# Patient Record
Sex: Female | Born: 1975 | Race: White | Hispanic: No | Marital: Married | Smoking: Never smoker
Health system: Southern US, Community
[De-identification: ages and names within clinical notes are randomized; demographics above are authoritative.]

## PROBLEM LIST (undated history)

## (undated) DIAGNOSIS — J45909 Unspecified asthma, uncomplicated: Secondary | ICD-10-CM

## (undated) DIAGNOSIS — M1990 Unspecified osteoarthritis, unspecified site: Secondary | ICD-10-CM

## (undated) HISTORY — PX: APPENDECTOMY: SHX54

## (undated) HISTORY — PX: TONSILLECTOMY: SUR1361

---

## 2015-10-26 ENCOUNTER — Emergency Department
Admission: EM | Admit: 2015-10-26 | Discharge: 2015-10-26 | Disposition: A | Discharge status: Home / Self Care | Payer: Managed Care, Other (non HMO) | Attending: Emergency Medicine | Admitting: Emergency Medicine

## 2015-10-26 ENCOUNTER — Encounter: Payer: Self-pay | Admitting: *Deleted

## 2015-10-26 DIAGNOSIS — K120 Recurrent oral aphthae: Secondary | ICD-10-CM | POA: Insufficient documentation

## 2015-10-26 DIAGNOSIS — M1990 Unspecified osteoarthritis, unspecified site: Secondary | ICD-10-CM | POA: Insufficient documentation

## 2015-10-26 DIAGNOSIS — T7840XA Allergy, unspecified, initial encounter: Secondary | ICD-10-CM | POA: Diagnosis present

## 2015-10-26 HISTORY — DX: Unspecified osteoarthritis, unspecified site: M19.90

## 2015-10-26 LAB — POCT PREGNANCY, URINE: PREG TEST UR: NEGATIVE

## 2015-10-26 LAB — CBC WITH DIFFERENTIAL/PLATELET
BASOS ABS: 0.1 10*3/uL (ref 0–0.1)
BASOS PCT: 1 %
EOS ABS: 0.3 10*3/uL (ref 0–0.7)
Eosinophils Relative: 4 %
HEMATOCRIT: 41.6 % (ref 35.0–47.0)
HEMOGLOBIN: 14 g/dL (ref 12.0–16.0)
Lymphocytes Relative: 23 %
Lymphs Abs: 1.6 10*3/uL (ref 1.0–3.6)
MCH: 30.1 pg (ref 26.0–34.0)
MCHC: 33.7 g/dL (ref 32.0–36.0)
MCV: 89.2 fL (ref 80.0–100.0)
MONOS PCT: 5 %
Monocytes Absolute: 0.3 10*3/uL (ref 0.2–0.9)
NEUTROS ABS: 4.5 10*3/uL (ref 1.4–6.5)
NEUTROS PCT: 67 %
Platelets: 302 10*3/uL (ref 150–440)
RBC: 4.66 MIL/uL (ref 3.80–5.20)
RDW: 13.9 % (ref 11.5–14.5)
WBC: 6.7 10*3/uL (ref 3.6–11.0)

## 2015-10-26 LAB — BASIC METABOLIC PANEL
ANION GAP: 7 (ref 5–15)
BUN: 13 mg/dL (ref 6–20)
CHLORIDE: 107 mmol/L (ref 101–111)
CO2: 24 mmol/L (ref 22–32)
CREATININE: 0.86 mg/dL (ref 0.44–1.00)
Calcium: 9.6 mg/dL (ref 8.9–10.3)
GFR calc non Af Amer: >60 mL/min (ref >60)
Glucose, Bld: 108 mg/dL — ABNORMAL HIGH (ref 65–99)
POTASSIUM: 4 mmol/L (ref 3.5–5.1)
SODIUM: 138 mmol/L (ref 135–145)

## 2015-10-26 LAB — MONONUCLEOSIS SCREEN: MONO SCREEN: NEGATIVE

## 2015-10-26 MED ORDER — MAGIC MOUTHWASH: 5.0000 mL | Freq: Four times a day (QID) | ORAL | Status: DC

## 2015-10-26 MED ORDER — AZITHROMYCIN 250 MG PO TABS: ORAL_TABLET | ORAL | Status: DC

## 2015-10-26 MED ORDER — DEXAMETHASONE SODIUM PHOSPHATE 10 MG/ML IJ SOLN
10.0000 mg | Freq: Once | INTRAMUSCULAR | Status: AC
  Administered 2015-10-26: 10 mg via INTRAVENOUS

## 2015-10-26 MED ORDER — DEXAMETHASONE SODIUM PHOSPHATE 10 MG/ML IJ SOLN
INTRAMUSCULAR | Status: AC
  Administered 2015-10-26: 10 mg via INTRAVENOUS
  Filled 2015-10-26: qty 1

## 2015-10-26 NOTE — Discharge Instructions (Signed)
Discontinue taking Keflex. Begin taking Zithromax for 5 days. Also use Magic mouthwash 1 teaspoon before meals and at bedtime swish for 3-5 minutes and swallow. Follow-up with Dr. Willeen CassBennett at [facility] if any continued problems.

## 2015-10-26 NOTE — ED Provider Notes (Signed)
[facility] Emergency Department Provider Note   ____________________________________________  Time seen: Approximately 11:32 AM  I have reviewed the triage vital signs and the nursing notes.   HISTORY  Chief Complaint Allergic Reaction   HPI Angela Chandler is a 40 y.o. female is here with complaint of rash to her face and chest. Patient states that she was started on an antibiotic as he had 5 doses of Keflex for "strep throat". Patient [facility] and [facility] with [facility]. She is allergic to penicillin but has not had problems with Keflex in the past. Patient began seeing some rash this morning and felt as if she was wheezing. Patient took Benadryl at approximately 8:00 and also 2 puffs off her daughters albuterol inhaler. Patient states she is breathing much better and there is decreased itching however she is concerned about the rash. She is unaware of any fever. She has continued to eat and drink. She is talking without any problems with her saliva. She rates her pain is 3/10.   Past Medical History  Diagnosis Date  . Arthritis     There are no active problems to display for this patient.   History reviewed. No pertinent past surgical history.  Current Outpatient Rx  Name  Route  Sig  Dispense  Refill  . azithromycin (ZITHROMAX Z-PAK) 250 MG tablet      Take 2 tablets (500 mg) on  Day 1,  followed by 1 tablet (250 mg) once daily on Days 2 through 5.   6 each   0   . magic mouthwash SOLN   Oral   Take 5 mLs by mouth 4 (four) times daily.   100 mL   0     Allergies Penicillins  No family history on file.  Social History Social History  Substance Use Topics  . Smoking status: Never Smoker   . Smokeless tobacco: None  . Alcohol Use: No    Review of Systems Constitutional: No fever/chills Eyes: No visual changes. ENT: Positive sore throat. Cardiovascular: Denies chest pain. Respiratory: Denies  shortness of breath. Positive wheezing prior to arrival in the emergency room. Gastrointestinal: No nausea, no vomiting.   Musculoskeletal: Negative for back pain. Skin: Positive for rash face and anterior chest. Neurological: Negative for headaches, focal weakness or numbness.  10-point ROS otherwise negative.  ____________________________________________   PHYSICAL EXAM:  VITAL SIGNS: ED Triage Vitals  Enc Vitals Group     BP 10/26/15 1101 142/75 mmHg     Pulse Rate 10/26/15 1101 86     Resp 10/26/15 1101 16     Temp 10/26/15 1101 98.6 F (37 C)     Temp Source 10/26/15 1101 Oral     SpO2 10/26/15 1101 98 %     Weight 10/26/15 1101 246 lb (111.585 kg)     Height 10/26/15 1101  (1.676 m)     Head Cir --      Peak Flow --      Pain Score 10/26/15 1102 3     Pain Loc --      Pain Edu? --      Excl. in GC? --     Constitutional: Alert and oriented. Well appearing and in no acute distress. Eyes: Conjunctivae are normal. PERRL. EOMI. Head: Atraumatic. Nose: No congestion/rhinnorhea. Mouth/Throat: Mucous membranes are moist.  Oropharynx non-erythematous.There is a shallow small aphthous type ulcer in the right tonsillar crypts but no exudate or tonsillar  enlargement seen. Neck: No stridor.   Hematological/Lymphatic/Immunilogical: No cervical lymphadenopathy. Cardiovascular: Normal rate, regular rhythm. Grossly normal heart sounds.  Good peripheral circulation. Respiratory: Normal respiratory effort.  No retractions. Lungs CTAB. Currently patient is not having any respiratory issues such as shortness of breath or wheezing. Patient was able to speak in complete sentences without any difficulty. Musculoskeletal: Moves upper and lower extremities without any difficulty. Patient patient had normal gait while in the emergency room. Neurologic:  Normal speech and language. No gross focal neurologic deficits are appreciated. No gait instability. Skin:  Skin is warm, dry. There is  a fine erythematous rash localized on the anterior chest. There is an occasional red spot on her face. No other rash is noted other than her upper extremities. There is no involvement on the back or abdomen. Nothing is seen on the lower extremities. Psychiatric: Mood and affect are normal. Speech and behavior are normal.  ____________________________________________   LABS (all labs ordered are listed, but only abnormal results are displayed)  Labs Reviewed  BASIC METABOLIC PANEL - Abnormal; Notable for the following:    Glucose, Bld 108 (*)    All other components within normal limits  CBC WITH DIFFERENTIAL/PLATELET  MONONUCLEOSIS SCREEN  POC URINE PREG, ED  POCT PREGNANCY, URINE     PROCEDURES  Procedure(s) performed: None  Critical Care performed: No  ____________________________________________   INITIAL IMPRESSION / ASSESSMENT AND PLAN / ED COURSE  Pertinent labs & imaging results that were available during my care of the patient were reviewed by me and considered in my medical decision making (see chart for details).  Patient had no further difficulty and was discharged improved. Patient is to discontinue taking Keflex. Because she had a temperature 100.2 when she was diagnosed with strep throat she was placed on Zithromax for 5 days. She is also given a prescription for Magic mouthwash for her aphthous ulcer on the right side of her soft palate. Patient is follow-[facility] if any continued problems. Patient is encouraged to drink plenty of fluids even if she does not have appetite to eat. ____________________________________________   FINAL CLINICAL IMPRESSION(S) / ED DIAGNOSES  Final diagnoses:  Aphthous ulcer of mouth      NEW MEDICATIONS STARTED DURING THIS VISIT:  Discharge Medication List as of 10/26/2015  1:19 PM    START taking these medications   Details  azithromycin (ZITHROMAX Z-PAK) 250 MG tablet Take 2 tablets (500 mg)  on  Day 1,  followed by 1 tablet (250 mg) once daily on Days 2 through 5., Print    magic mouthwash SOLN Take 5 mLs by mouth 4 (four) times daily., Starting 10/26/2015, Until Discontinued, Print         Note:  This document was prepared using Dragon voice recognition software and may include unintentional dictation errors.    Tommi RumpsRhonda L Summers, PA-C 10/26/15 1458  Arnaldo NatalPaul F Malinda, MD 10/26/15 [phone]

## 2015-10-26 NOTE — ED Notes (Signed)
Pt has taken 5 doses of Keflex for strep throat, pt had hives on face, neck and cheeks today, pt took benadryl prior to coming to ED, pt is in no resp distress

## 2015-10-26 NOTE — ED Notes (Signed)
Patient presents to the ED with rash to face and chest.  Patient states she was recently started on a new antibiotic.  Patient is in no obvious distress at this time.

## 2015-12-05 ENCOUNTER — Encounter: Payer: Self-pay | Admitting: Emergency Medicine

## 2015-12-05 ENCOUNTER — Emergency Department: Payer: Managed Care, Other (non HMO)

## 2015-12-05 ENCOUNTER — Emergency Department
Admission: EM | Admit: 2015-12-05 | Discharge: 2015-12-05 | Disposition: A | Discharge status: Home / Self Care | Payer: Managed Care, Other (non HMO) | Attending: Emergency Medicine | Admitting: Emergency Medicine

## 2015-12-05 DIAGNOSIS — K5900 Constipation, unspecified: Secondary | ICD-10-CM | POA: Diagnosis not present

## 2015-12-05 DIAGNOSIS — M1990 Unspecified osteoarthritis, unspecified site: Secondary | ICD-10-CM | POA: Diagnosis not present

## 2015-12-05 DIAGNOSIS — K824 Cholesterolosis of gallbladder: Secondary | ICD-10-CM | POA: Diagnosis not present

## 2015-12-05 DIAGNOSIS — R1011 Right upper quadrant pain: Secondary | ICD-10-CM | POA: Diagnosis present

## 2015-12-05 DIAGNOSIS — Z79899 Other long term (current) drug therapy: Secondary | ICD-10-CM | POA: Diagnosis not present

## 2015-12-05 DIAGNOSIS — J45909 Unspecified asthma, uncomplicated: Secondary | ICD-10-CM | POA: Insufficient documentation

## 2015-12-05 DIAGNOSIS — N23 Unspecified renal colic: Secondary | ICD-10-CM | POA: Insufficient documentation

## 2015-12-05 DIAGNOSIS — K5909 Other constipation: Secondary | ICD-10-CM

## 2015-12-05 HISTORY — DX: Unspecified asthma, uncomplicated: J45.909

## 2015-12-05 LAB — CBC
HCT: 43.7 % (ref 35.0–47.0)
Hemoglobin: 15 g/dL (ref 12.0–16.0)
MCH: 30.4 pg (ref 26.0–34.0)
MCHC: 34.4 g/dL (ref 32.0–36.0)
MCV: 88.4 fL (ref 80.0–100.0)
Platelets: 221 10*3/uL (ref 150–440)
RBC: 4.94 MIL/uL (ref 3.80–5.20)
RDW: 15 % — ABNORMAL HIGH (ref 11.5–14.5)
WBC: 11.8 10*3/uL — ABNORMAL HIGH (ref 3.6–11.0)

## 2015-12-05 LAB — URINALYSIS COMPLETE WITH MICROSCOPIC (ARMC ONLY)
BILIRUBIN URINE: NEGATIVE
GLUCOSE, UA: NEGATIVE mg/dL
HGB URINE DIPSTICK: NEGATIVE
Ketones, ur: NEGATIVE mg/dL
LEUKOCYTES UA: NEGATIVE
NITRITE: NEGATIVE
Protein, ur: NEGATIVE mg/dL
SPECIFIC GRAVITY, URINE: 1.016 (ref 1.005–1.030)
pH: 6 (ref 5.0–8.0)

## 2015-12-05 LAB — COMPREHENSIVE METABOLIC PANEL
ALT: 19 U/L (ref 14–54)
ANION GAP: 6 (ref 5–15)
AST: 21 U/L (ref 15–41)
Albumin: 4.1 g/dL (ref 3.5–5.0)
Alkaline Phosphatase: 54 U/L (ref 38–126)
BILIRUBIN TOTAL: 0.4 mg/dL (ref 0.3–1.2)
BUN: 13 mg/dL (ref 6–20)
CO2: 24 mmol/L (ref 22–32)
Calcium: 8.9 mg/dL (ref 8.9–10.3)
Chloride: 107 mmol/L (ref 101–111)
Creatinine, Ser: 0.76 mg/dL (ref 0.44–1.00)
Glucose, Bld: 126 mg/dL — ABNORMAL HIGH (ref 65–99)
POTASSIUM: 3.9 mmol/L (ref 3.5–5.1)
Sodium: 137 mmol/L (ref 135–145)
TOTAL PROTEIN: 7.4 g/dL (ref 6.5–8.1)

## 2015-12-05 LAB — LIPASE, BLOOD: Lipase: 37 U/L (ref 11–51)

## 2015-12-05 LAB — POCT PREGNANCY, URINE: Preg Test, Ur: NEGATIVE

## 2015-12-05 MED ORDER — IBUPROFEN 800 MG PO TABS: 800.0000 mg | ORAL_TABLET | Freq: Four times a day (QID) | ORAL | Status: DC | PRN

## 2015-12-05 MED ORDER — DIATRIZOATE MEGLUMINE & SODIUM 66-10 % PO SOLN
15.0000 mL | Freq: Once | ORAL | Status: AC
  Administered 2015-12-05: 15 mL via ORAL

## 2015-12-05 MED ORDER — IOPAMIDOL (ISOVUE-300) INJECTION 61%
100.0000 mL | Freq: Once | INTRAVENOUS | Status: AC | PRN
  Administered 2015-12-05: 100 mL via INTRAVENOUS

## 2015-12-05 MED ORDER — TAMSULOSIN HCL 0.4 MG PO CAPS: 0.4000 mg | ORAL_CAPSULE | Freq: Every day | ORAL | Status: DC

## 2015-12-05 MED ORDER — KETOROLAC TROMETHAMINE 30 MG/ML IJ SOLN
30.0000 mg | Freq: Once | INTRAMUSCULAR | Status: AC
  Administered 2015-12-05: 30 mg via INTRAVENOUS
  Filled 2015-12-05: qty 1

## 2015-12-05 MED ORDER — ONDANSETRON HCL 4 MG/2ML IJ SOLN
4.0000 mg | Freq: Once | INTRAMUSCULAR | Status: AC
  Administered 2015-12-05: 4 mg via INTRAVENOUS
  Filled 2015-12-05: qty 2

## 2015-12-05 MED ORDER — HYDROCODONE-ACETAMINOPHEN 5-325 MG PO TABS: 1.0000 | ORAL_TABLET | Freq: Four times a day (QID) | ORAL | Status: DC | PRN

## 2015-12-05 MED ORDER — MORPHINE SULFATE (PF) 4 MG/ML IV SOLN
4.0000 mg | Freq: Once | INTRAVENOUS | Status: AC
  Administered 2015-12-05: 4 mg via INTRAVENOUS
  Filled 2015-12-05: qty 1

## 2015-12-05 MED ORDER — SODIUM CHLORIDE 0.9 % IV BOLUS (SEPSIS)
1000.0000 mL | Freq: Once | INTRAVENOUS | Status: AC
  Administered 2015-12-05: 1000 mL via INTRAVENOUS

## 2015-12-05 MED ORDER — HYDROMORPHONE HCL 1 MG/ML IJ SOLN
1.0000 mg | Freq: Once | INTRAMUSCULAR | Status: AC
  Administered 2015-12-05: 1 mg via INTRAVENOUS

## 2015-12-05 NOTE — ED Notes (Signed)
Pt reports vomiting since yesterday, reports back pain that radiates through to right front of abdomen.

## 2015-12-05 NOTE — Discharge Instructions (Signed)
Stay hydrated.  Take motrin for pain.   Take vicodin for severe pain.   Take zofran for nausea.   Take flomax daily.   You have polyp in your gallbladder. You may need follow up ultrasounds with your doctor. Please talk to your doctor about it.   See urology if you still have pain in a week   Return to ER if you have worse abdominal pain, vomiting, fevers.

## 2015-12-05 NOTE — ED Provider Notes (Signed)
CSN: [id number]     Arrival date & time 12/05/15  0856 History   First MD Initiated Contact with Patient 12/05/15 0935     Chief Complaint  Patient presents with  . Emesis  . Back Pain     (Consider location/radiation/quality/duration/timing/severity/associated sxs/prior Treatment) The history is provided by the patient.  Emelynn Rance is a 40 y.o. female hx of psoriatic arthritis, here with vomiting, flank pain. Patient states that she has acute onset of right upper quadrant pain radiating to her back since yesterday. States that the pain is sharp and associated with some nausea. Had several episodes of vomiting last night that improved so she was able to eat dinner. This morning woke up and had worsening right upper quadrant pain and was unable to keep anything down. Denies any fevers or chills. Denies any Urinary symptoms and has no history of gallstones or kidney stones. Has previous history of appendectomy and tonsillectomy in the past.    Past Medical History  Diagnosis Date  . Arthritis   . Asthma    Past Surgical History  Procedure Laterality Date  . Appendectomy    . Tonsillectomy     No family history on file. Social History  Substance Use Topics  . Smoking status: Never Smoker   . Smokeless tobacco: None  . Alcohol Use: No   OB History    No data available     Review of Systems  Gastrointestinal: Positive for vomiting.  Musculoskeletal: Positive for back pain.  All other systems reviewed and are negative.     Allergies  Keflex; Penicillin g; and Penicillins  Home Medications   Prior to Admission medications   Medication Sig Start Date End Date Taking? Authorizing Provider  meloxicam (MOBIC) 15 MG tablet Take 1 tablet by mouth daily. 12/05/15  Yes Historical Provider, MD   BP 140/82 mmHg  Pulse 87  Temp(Src) 98.4 F (36.9 C) (Oral)  Resp 16  Ht  (1.676 m)  Wt 245 lb (111.131 kg)  BMI 39.56 kg/m2  SpO2 98%  LMP 11/16/2015 Physical Exam   Constitutional: She is oriented to person, place, and time.  Uncomfortable   HENT:  Head: Normocephalic.  MM slightly dry   Eyes: Conjunctivae are normal. Pupils are equal, round, and reactive to light.  Neck: Normal range of motion. Neck supple.  Cardiovascular: Normal rate, regular rhythm and normal heart sounds.   Pulmonary/Chest: Effort normal and breath sounds normal. No respiratory distress. She has no wheezes. She has no rales.  Abdominal: Soft. Bowel sounds are normal.  + RUQ tenderness. Mild murphy's sign. Mild R CVAT   Musculoskeletal: Normal range of motion.  Neurological: She is alert and oriented to person, place, and time.  Skin: Skin is warm and dry.  Psychiatric: She has a normal mood and affect. Her behavior is normal. Judgment and thought content normal.  Nursing note and vitals reviewed.   ED Course  Procedures (including critical care time) Labs Review Labs Reviewed  COMPREHENSIVE METABOLIC PANEL - Abnormal; Notable for the following:    Glucose, Bld 126 (*)    All other components within normal limits  CBC - Abnormal; Notable for the following:    WBC 11.8 (*)    RDW 15.0 (*)    All other components within normal limits  URINALYSIS COMPLETEWITH MICROSCOPIC ([facility] ONLY) - Abnormal; Notable for the following:    Color, Urine YELLOW (*)    APPearance CLOUDY (*)    Bacteria, UA RARE (*)  Squamous Epithelial / LPF 0-5 (*)    All other components within normal limits  LIPASE, BLOOD  POC URINE PREG, ED  POCT PREGNANCY, URINE    Imaging Review Ct Abdomen Pelvis W Contrast  12/05/2015  CLINICAL DATA:  Mid upper abdominal pain and burning with vomiting for 1 day, status post appendectomy EXAM: CT ABDOMEN AND PELVIS WITH CONTRAST TECHNIQUE: Multidetector CT imaging of the abdomen and pelvis was performed using the standard protocol following bolus administration of intravenous contrast. CONTRAST:  100mL ISOVUE-300 IOPAMIDOL (ISOVUE-300) INJECTION 61%  COMPARISON:  None. FINDINGS: Lower chest:  Lung bases are unremarkable. Hepatobiliary: Enhanced liver shows no focal mass. No biliary ductal dilatation. No calcified gallstones are noted within gallbladder. Pancreas: Enhanced pancreas is unremarkable. Spleen: Enhanced spleen is unremarkable. Adrenals/Urinary Tract: No adrenal gland mass. Enhanced kidneys are symmetrical in size. No hydronephrosis or hydroureter. No nephrolithiasis. No calcified ureteral calculi. Stomach/Bowel: No small bowel obstruction. Oral contrast material was given to the patient. Moderate stool noted within cecum. No pericecal inflammation. The patient is status post appendectomy. No evidence of colitis or diverticulitis. Moderate stool noted within rectum. The rectum measures 6.1 cm in diameter. Mild fecal impaction cannot be excluded. Vascular/Lymphatic: No aortic aneurysm. No retroperitoneal or mesenteric adenopathy. Reproductive: The uterus is anteflexed unremarkable. No adnexal masses noted. Probable hemorrhagic follicle within left ovary measures 1.8 cm. No pelvic free fluid is noted. Other: The urinary bladder is unremarkable. No ascites or free abdominal air. No inguinal adenopathy. Tiny umbilical hernia containing fat without evidence of acute complication. Musculoskeletal: No destructive bony lesions are noted. Minimal degenerative changes lower thoracic spine. IMPRESSION: 1. There is no evidence of acute inflammatory process within abdomen. 2. Moderate stool noted within cecum. No pericecal inflammation. The patient is status post appendectomy. 3. No small bowel obstruction. 4. Moderate stool noted within rectum. Mild fecal impaction cannot be excluded. 5. Question hemorrhagic follicle within left ovary measures 1.8 cm. No pelvic free fluid. Electronically Signed   By: Natasha MeadLiviu  Pop M.D.   On: 12/05/2015 13:01   [place] Renal  12/05/2015  CLINICAL DATA:  Right upper quadrant pain. EXAM: RENAL / URINARY TRACT ULTRASOUND COMPLETE  COMPARISON:  No prior. FINDINGS: Right Kidney: Length: 11.4 cm. Echogenicity within normal limits. No mass or hydronephrosis visualized. 7 mm nonobstructing renal calculus. Mm. Left Kidney: Length: 9.9 cm. Echogenicity within normal limits. No mass or hydronephrosis visualized. 8 mm nonobstructing renal calculus. Bladder: Appears normal for degree of bladder distention. IMPRESSION: Bilateral nonobstructing renal calculi. Exam otherwise unremarkable. Electronically Signed   By: Maisie Fushomas  Register   On: 12/05/2015 11:39   [place] Abdomen Limited Ruq  12/05/2015  CLINICAL DATA:  Right quadrant pain. EXAM: US ABDOMEN LIMITED - RIGHT UPPER QUADRANT COMPARISON:  None. FINDINGS: Gallbladder: Small 4 mm polyp noted. No gallstones noted . Minimal prominence of the gallbladder wall at 3 mm noted. No pericholecystic fluid collection. Negative Murphy sign. Common bile duct: Diameter: 3.3 mm Liver: There is echodense consistent fatty infiltration and/or hepatocellular disease. No focal hepatic abnormality identified. IMPRESSION: 1. Liver is echogenic consistent with fatty infiltration and/or hepatocellular disease. No focal hepatic abnormality identified. 2. Small gallbladder polyp at 4 mm. No gallstones noted. Minimal prominence of gallbladder wall 3 mm noted. No pericholecystic fluid collection. Negative Murphy sign. Electronically Signed   By: Maisie Fushomas  Register   On: 12/05/2015 11:37   I have personally reviewed and evaluated these images and lab results as part of my medical decision-making.   EKG Interpretation None  MDM   Final diagnoses:  RUQ pain   Caylie Sandquist is a 40 y.o. female here with RUQ, R flank pain. Likely biliary colic vs renal colic vs pancreatitis. Will get labs, lipase, CMP, UA. Will get RUQ and renal US. Will hydrate and reassess.   12 pm US showed some nonobstructing renal calculi and gallbladder polyp with no pericholecystic fluid. Still has some pain so will get CT ab/pel.   2:05  PM CT showed moderate stool and L ovarian cyst which was chronic. Has no LLQ tenderness. Patient had a bowel movement yesterday. Pain improved now. I think pain likely from renal colic. UA showed no infection. Will dc home with motrin, vicodin, flomax, zofran. No hydro on CT or [place]. Will dc home.   Charlynne Pander, MD 12/05/15 [phone]

## 2016-03-07 ENCOUNTER — Emergency Department
Admission: EM | Admit: 2016-03-07 | Discharge: 2016-03-07 | Disposition: A | Discharge status: Home / Self Care | Payer: Managed Care, Other (non HMO) | Attending: Emergency Medicine | Admitting: Emergency Medicine

## 2016-03-07 ENCOUNTER — Encounter: Payer: Self-pay | Admitting: Emergency Medicine

## 2016-03-07 ENCOUNTER — Emergency Department: Payer: Managed Care, Other (non HMO)

## 2016-03-07 ENCOUNTER — Other Ambulatory Visit: Payer: Self-pay

## 2016-03-07 DIAGNOSIS — R1030 Lower abdominal pain, unspecified: Secondary | ICD-10-CM | POA: Diagnosis not present

## 2016-03-07 DIAGNOSIS — K8050 Calculus of bile duct without cholangitis or cholecystitis without obstruction: Secondary | ICD-10-CM | POA: Diagnosis not present

## 2016-03-07 DIAGNOSIS — R109 Unspecified abdominal pain: Secondary | ICD-10-CM

## 2016-03-07 DIAGNOSIS — J45909 Unspecified asthma, uncomplicated: Secondary | ICD-10-CM | POA: Diagnosis not present

## 2016-03-07 DIAGNOSIS — R1011 Right upper quadrant pain: Secondary | ICD-10-CM | POA: Diagnosis present

## 2016-03-07 DIAGNOSIS — Z79899 Other long term (current) drug therapy: Secondary | ICD-10-CM | POA: Diagnosis not present

## 2016-03-07 DIAGNOSIS — Z791 Long term (current) use of non-steroidal anti-inflammatories (NSAID): Secondary | ICD-10-CM | POA: Insufficient documentation

## 2016-03-07 LAB — URINALYSIS COMPLETE WITH MICROSCOPIC (ARMC ONLY)
Bacteria, UA: NONE SEEN
Bilirubin Urine: NEGATIVE
Glucose, UA: NEGATIVE mg/dL
Ketones, ur: NEGATIVE mg/dL
NITRITE: NEGATIVE
PH: 6 (ref 5.0–8.0)
PROTEIN: 30 mg/dL — AB
SPECIFIC GRAVITY, URINE: 1.014 (ref 1.005–1.030)

## 2016-03-07 LAB — COMPREHENSIVE METABOLIC PANEL
ALK PHOS: 55 U/L (ref 38–126)
ALT: 21 U/L (ref 14–54)
ANION GAP: 6 (ref 5–15)
AST: 21 U/L (ref 15–41)
Albumin: 4.5 g/dL (ref 3.5–5.0)
BILIRUBIN TOTAL: 0.8 mg/dL (ref 0.3–1.2)
BUN: 13 mg/dL (ref 6–20)
CALCIUM: 9.2 mg/dL (ref 8.9–10.3)
CO2: 23 mmol/L (ref 22–32)
Chloride: 108 mmol/L (ref 101–111)
Creatinine, Ser: 0.84 mg/dL (ref 0.44–1.00)
GFR calc non Af Amer: >60 mL/min (ref >60)
Glucose, Bld: 114 mg/dL — ABNORMAL HIGH (ref 65–99)
Potassium: 3.9 mmol/L (ref 3.5–5.1)
SODIUM: 137 mmol/L (ref 135–145)
TOTAL PROTEIN: 7.6 g/dL (ref 6.5–8.1)

## 2016-03-07 LAB — CBC
HCT: 42.6 % (ref 35.0–47.0)
HEMOGLOBIN: 14.2 g/dL (ref 12.0–16.0)
MCH: 29.6 pg (ref 26.0–34.0)
MCHC: 33.4 g/dL (ref 32.0–36.0)
MCV: 88.7 fL (ref 80.0–100.0)
Platelets: 283 10*3/uL (ref 150–440)
RBC: 4.81 MIL/uL (ref 3.80–5.20)
RDW: 13.4 % (ref 11.5–14.5)
WBC: 6.9 10*3/uL (ref 3.6–11.0)

## 2016-03-07 LAB — POCT PREGNANCY, URINE: PREG TEST UR: NEGATIVE

## 2016-03-07 LAB — LIPASE, BLOOD: Lipase: 26 U/L (ref 11–51)

## 2016-03-07 MED ORDER — OXYCODONE-ACETAMINOPHEN 5-325 MG PO TABS: 2.0000 | ORAL_TABLET | Freq: Four times a day (QID) | ORAL | 0 refills | Status: DC | PRN

## 2016-03-07 MED ORDER — ONDANSETRON HCL 4 MG/2ML IJ SOLN
4.0000 mg | Freq: Once | INTRAMUSCULAR | Status: AC
  Administered 2016-03-07: 4 mg via INTRAVENOUS
  Filled 2016-03-07: qty 2

## 2016-03-07 MED ORDER — CIPROFLOXACIN IN D5W 400 MG/200ML IV SOLN
400.0000 mg | Freq: Once | INTRAVENOUS | Status: AC
  Administered 2016-03-07: 400 mg via INTRAVENOUS
  Filled 2016-03-07: qty 200

## 2016-03-07 MED ORDER — HYDROMORPHONE HCL 1 MG/ML IJ SOLN
1.0000 mg | Freq: Once | INTRAMUSCULAR | Status: AC
  Administered 2016-03-07: 1 mg via INTRAVENOUS
  Filled 2016-03-07: qty 1

## 2016-03-07 MED ORDER — CIPROFLOXACIN HCL 500 MG PO TABS: 500.0000 mg | ORAL_TABLET | Freq: Two times a day (BID) | ORAL | 0 refills | Status: DC

## 2016-03-07 MED ORDER — PANTOPRAZOLE SODIUM 40 MG PO TBEC: 40.0000 mg | DELAYED_RELEASE_TABLET | Freq: Every day | ORAL | 1 refills | Status: DC

## 2016-03-07 MED ORDER — SODIUM CHLORIDE 0.9 % IV SOLN
Freq: Once | INTRAVENOUS | Status: AC
  Administered 2016-03-07: 1000 mL via INTRAVENOUS

## 2016-03-07 NOTE — ED Notes (Signed)
Discharge instructions reviewed with patient. Patient verbalized understanding. Patient ambulated to lobby without difficulty.   

## 2016-03-07 NOTE — ED Triage Notes (Signed)
Says ruq p ain with nausea.  Says she has had this before and dx gall bladder polyp.

## 2016-03-07 NOTE — ED Provider Notes (Signed)
[facility] Emergency Department Provider Note        Time seen: ----------------------------------------- 3:10 PM on 03/07/2016 -----------------------------------------    I have reviewed the triage vital signs and the nursing notes.   HISTORY  Chief Complaint Abdominal Pain    HPI Angela Chandler is a 40 y.o. female who presents to the ER for right upper quadrant pain and nausea. Patient states she had this pain before was diagnosed with a gallbladder polyp. She denies fevers or chills, has had nausea and poor appetite, she denies diarrhea.Nothing makes the pain better or worse.   Past Medical History:  Diagnosis Date  . Arthritis   . Asthma     There are no active problems to display for this patient.   Past Surgical History:  Procedure Laterality Date  . APPENDECTOMY    . TONSILLECTOMY      Allergies Keflex [cephalexin]; Penicillin g; and Penicillins  Social History Social History  Substance Use Topics  . Smoking status: Never Smoker  . Smokeless tobacco: Never Used  . Alcohol use No    Review of Systems Constitutional: Negative for fever. Cardiovascular: Negative for chest pain. Respiratory: Negative for shortness of breath. Gastrointestinal: Positive for abdominal pain, nausea Genitourinary: Negative for dysuria. Musculoskeletal: Positive for back pain Skin: Negative for rash. Neurological: Negative for headaches, focal weakness or numbness.  10-point ROS otherwise negative.  ____________________________________________   PHYSICAL EXAM:  VITAL SIGNS: ED Triage Vitals  Enc Vitals Group     BP 03/07/16 1129 138/79     Pulse Rate 03/07/16 1129 79     Resp 03/07/16 1129 16     Temp 03/07/16 1129 98.3 F (36.8 C)     Temp Source 03/07/16 1129 Oral     SpO2 03/07/16 1129 99 %     Weight 03/07/16 1130 239 lb (108.4 kg)     Height 03/07/16 1130 5\' 6"  (1.676 m)     Head Circumference --      Peak Flow --      Pain  Score 03/07/16 1130 7     Pain Loc --      Pain Edu? --      Excl. in GC? --     Constitutional: Alert and oriented. Mild distress Eyes: Conjunctivae are normal. PERRL. Normal extraocular movements. ENT   Head: Normocephalic and atraumatic.   Nose: No congestion/rhinnorhea.   Mouth/Throat: Mucous membranes are moist.   Neck: No stridor. Cardiovascular: Normal rate, regular rhythm. No murmurs, rubs, or gallops. Respiratory: Normal respiratory effort without tachypnea nor retractions. Breath sounds are clear and equal bilaterally. No wheezes/rales/rhonchi. Gastrointestinal: Right upper quadrant tenderness, no rebound or guarding. Normal bowel sounds. Right CVA tenderness. Musculoskeletal: Nontender with normal range of motion in all extremities. No lower extremity tenderness nor edema. Neurologic:  Normal speech and language. No gross focal neurologic deficits are appreciated.  Skin:  Skin is warm, dry and intact. No rash noted. Psychiatric: Mood and affect are normal. Speech and behavior are normal.  ____________________________________________  ED COURSE:  Pertinent labs & imaging results that were available during my care of the patient were reviewed by me and considered in my medical decision making (see chart for details). Clinical Course  Patient presents to the ED with abdominal pain of uncertain etiology. We will assess with ultrasound and basic labs.  Procedures ____________________________________________   LABS (pertinent positives/negatives)  Labs Reviewed  COMPREHENSIVE METABOLIC PANEL - Abnormal; Notable for the following:  Result Value   Glucose, Bld 114 (*)    All other components within normal limits  URINALYSIS COMPLETEWITH MICROSCOPIC ([facility] ONLY) - Abnormal; Notable for the following:    Color, Urine RED (*)    APPearance CLOUDY (*)    Hgb urine dipstick 3+ (*)    Protein, ur 30 (*)    Leukocytes, UA TRACE (*)    Squamous Epithelial / LPF  0-5 (*)    All other components within normal limits  LIPASE, BLOOD  CBC  POCT PREGNANCY, URINE  POC URINE PREG, ED    RADIOLOGY Images were viewed by me  Right upper quadrant ultrasound, renal ultrasound IMPRESSION: Negative exam. IMPRESSION: 1.  Tiny gallbladder polyps measuring up 3 mm.  No gallstones noted.  2. Gallbladder wall is slightly thickened. This can be seen with hypoproteinemia and acalculous cholecystitis. ____________________________________________  FINAL ASSESSMENT AND PLAN  Flank pain  Plan: Patient with labs and imaging as dictated above. Patient's symptoms are probably gallbladder related. Her labs are reassuring other than her urine. She received IV Cipro for her urinalysis. I will refer her to to general surgery for close outpatient follow-up.   Emily FilbertWilliams, Jonathan E, MD   Note: This dictation was prepared with Dragon dictation. Any transcriptional errors that result from this process are unintentional    Emily FilbertJonathan E Williams, MD 03/07/16 1711

## 2016-03-11 ENCOUNTER — Other Ambulatory Visit: Payer: Self-pay

## 2016-03-12 ENCOUNTER — Ambulatory Visit (INDEPENDENT_AMBULATORY_CARE_PROVIDER_SITE_OTHER): Payer: 59 | Admitting: Surgery

## 2016-03-12 ENCOUNTER — Encounter: Payer: Self-pay | Admitting: Surgery

## 2016-03-12 DIAGNOSIS — K824 Cholesterolosis of gallbladder: Secondary | ICD-10-CM

## 2016-03-12 DIAGNOSIS — K8050 Calculus of bile duct without cholangitis or cholecystitis without obstruction: Secondary | ICD-10-CM | POA: Diagnosis not present

## 2016-03-12 NOTE — Patient Instructions (Signed)
Please see blue pre care sheet for surgery information. We have your surgery scheduled for 03/26/16. Please call our office if you have questions or concerns.

## 2016-03-13 ENCOUNTER — Telehealth: Payer: Self-pay | Admitting: Surgery

## 2016-03-13 ENCOUNTER — Encounter: Payer: Self-pay | Admitting: Surgery

## 2016-03-13 DIAGNOSIS — K824 Cholesterolosis of gallbladder: Secondary | ICD-10-CM | POA: Insufficient documentation

## 2016-03-13 DIAGNOSIS — K8050 Calculus of bile duct without cholangitis or cholecystitis without obstruction: Secondary | ICD-10-CM | POA: Insufficient documentation

## 2016-03-13 NOTE — Telephone Encounter (Signed)
Pt advised of pre op date/time and sx date. Sx: 03/26/16 with Dr Dow AdolphLoflin--Laparoscopic cholecystectomy with gram.  Pre op: 03/19/16 between 9-1:00pm--Phone.   Patient made aware to call [phone], between 1-3:00pm the day before surgery, to find out what time to arrive.

## 2016-03-13 NOTE — Progress Notes (Signed)
Subjective:     Patient ID: Angela Chandler, female   DOB: [birth year], 40 y.o.   MRN: [id number]  HPI  40 year old female who comes in with complaints of right upper quadrant pain that has been ongoing for about 3-4 months. Patient states initially this pain intermittently and initially was more with fattier foods. Patient states that over the past week it has been much worse and she was just in the emergency department on 10/19. She was evaluated there and had a normal white blood cell count and shown to have some gallbladder polyps. The patient states that over the past week or so she's had continual soreness in the area and she'll get pain that's cramping intermittent moves around her right side and through to her shoulder blade anytime she eats. Patient has also had a decreased appetite along with this and feelings of bloating increased flatus as well as burping. Patient has had some diarrhea intermittently along with this as well but denies any constipation. Patient states that last Thursday she had a fever however she has not had any other fevers. Also denies any symptoms of dysuria, hematuria, urgency, frequency or foul-smelling urine however she did have a positive UA in the emergency department.  Past Medical History:  Diagnosis Date  . Arthritis   . Asthma    Past Surgical History:  Procedure Laterality Date  . APPENDECTOMY    . TONSILLECTOMY    Right ovarian cyst and Appy in 2001 Tonsils 1996  Family History  Problem Relation Age of Onset  . Hypertension Father    Social History   Social History  . Marital status: Married    Spouse name: N/A  . Number of children: N/A  . Years of education: N/A   Social History Main Topics  . Smoking status: Never Smoker  . Smokeless tobacco: Never Used  . Alcohol use No  . Drug use: Unknown  . Sexual activity: Not Asked   Other Topics Concern  . None   Social History Narrative  . None    Current Outpatient Prescriptions:  .   ciprofloxacin (CIPRO) 500 MG tablet, Take 1 tablet (500 mg total) by mouth 2 (two) times daily., Disp: 20 tablet, Rfl: 0 .  ibuprofen (ADVIL,MOTRIN) 800 MG tablet, Take 1 tablet (800 mg total) by mouth every 6 (six) hours as needed., Disp: 30 tablet, Rfl: 0 .  oxyCODONE-acetaminophen (PERCOCET) 5-325 MG tablet, Take 2 tablets by mouth every 6 (six) hours as needed for moderate pain or severe pain., Disp: 20 tablet, Rfl: 0 .  pantoprazole (PROTONIX) 40 MG tablet, Take 1 tablet (40 mg total) by mouth daily., Disp: 30 tablet, Rfl: 1 .  sulfaSALAzine (AZULFIDINE) 500 MG tablet, Take 1 tablet by mouth 2 (two) times daily with a meal., Disp: , Rfl:  Allergies  Allergen Reactions  . Keflex [Cephalexin] Nausea And Vomiting and Rash  . Penicillin G Rash and Nausea And Vomiting  . Penicillins Rash    Has patient had a PCN reaction causing immediate rash, facial/tongue/throat swelling, SOB or lightheadedness with hypotension: Yes Has patient had a PCN reaction causing severe rash involving mucus membranes or skin necrosis: No Has patient had a PCN reaction that required hospitalization No Has patient had a PCN reaction occurring within the last 10 years: No If all of the above answers are "NO", then may proceed with Cephalosporin use.     Review of Systems  Constitutional: Positive for appetite change, chills, fatigue and fever. Negative for unexpected weight  change.  HENT: Negative for congestion and sore throat.   Respiratory: Negative for cough, chest tightness and shortness of breath.   Cardiovascular: Negative for chest pain and leg swelling.  Gastrointestinal: Positive for abdominal pain, diarrhea and nausea. Negative for abdominal distention, blood in stool, constipation and vomiting.  Genitourinary: Negative for dysuria, flank pain, hematuria and urgency.  Musculoskeletal: Negative for back pain and joint swelling.  Skin: Negative for color change, rash and wound.  Neurological: Negative for  dizziness and weakness.  Hematological: Negative for adenopathy. Does not bruise/bleed easily.  Psychiatric/Behavioral: Negative for agitation. The patient is not nervous/anxious.   All other systems reviewed and are negative.    Vitals:   03/12/16 1407  BP: 139/80  Pulse: 97  Temp: 97.9 F (36.6 C)       Objective:   Physical Exam  Constitutional: She is oriented to person, place, and time. She appears well-developed and well-nourished. No distress.  HENT:  Head: Normocephalic and atraumatic.  Right Ear: External ear normal.  Left Ear: External ear normal.  Nose: Nose normal.  Mouth/Throat: Oropharynx is clear and moist. No oropharyngeal exudate.  Eyes: EOM are normal. Pupils are equal, round, and reactive to light. No scleral icterus.  Neck: Normal range of motion. Neck supple. No tracheal deviation present.  Cardiovascular: Normal rate, regular rhythm, normal heart sounds and intact distal pulses.  Exam reveals no gallop and no friction rub.   No murmur heard. Pulmonary/Chest: Effort normal and breath sounds normal. No respiratory distress. She has no wheezes. She has no rales.  Abdominal: Soft. Bowel sounds are normal. She exhibits no distension. There is tenderness. There is no rebound and no guarding.  Obese, soft, tender in epigastrium and RUQ, no CVA tenderness  Musculoskeletal: Normal range of motion. She exhibits no edema, tenderness or deformity.  Neurological: She is alert and oriented to person, place, and time. No cranial nerve deficit.  Skin: Skin is warm and dry. No rash noted. No erythema. No pallor.  Psychiatric: She has a normal mood and affect. Her behavior is normal. Judgment and thought content normal.  Vitals reviewed.      CBC Latest Ref Rng & Units 03/07/2016 12/05/2015 10/26/2015  WBC 3.6 - 11.0 K/uL 6.9 11.8(H) 6.7  Hemoglobin 12.0 - 16.0 g/dL 16.1 09.6 04.5  Hematocrit 35.0 - 47.0 % 42.6 43.7 41.6  Platelets 150 - 440 K/uL 283 221 302   CMP Latest  Ref Rng & Units 03/07/2016 12/05/2015 10/26/2015  Glucose 65 - 99 mg/dL 409(W) 119(J) 478(G)  BUN 6 - 20 mg/dL 13 13 13   Creatinine 0.44 - 1.00 mg/dL 9.56 2.13 0.86  Sodium 135 - 145 mmol/L 137 137 138  Potassium 3.5 - 5.1 mmol/L 3.9 3.9 4.0  Chloride 101 - 111 mmol/L 108 107 107  CO2 22 - 32 mmol/L 23 24 24   Calcium 8.9 - 10.3 mg/dL 9.2 8.9 9.6  Total Protein 6.5 - 8.1 g/dL 7.6 7.4 -  Total Bilirubin 0.3 - 1.2 mg/dL 0.8 0.4 -  Alkaline Phos 38 - 126 U/L 55 54 -  AST 15 - 41 U/L 21 21 -  ALT 14 - 54 U/L 21 19 -   U/S: IMPRESSION: 1.  Tiny gallbladder polyps measuring up 3 mm.  No gallstones noted. 2. Gallbladder wall is slightly thickened. This can be seen with hypoproteinemia and acalculous cholecystitis.   Assessment:     40 yr old with gallbladder polyps and biliary colic    Plan:  I have personally reviewed her past medical history that includes psoriatic arthritis which is controlled mainly with ibuprofen or sulfasalazine. I have personally reviewed her laboratory values which did show an elevated white blood cell count as well as a UA that was positive however her LFTs were all within normal limits. Personally reviewed her ultrasound images which do show some gallbladder polyps however did not see any wall thickening or any dilatation of the common bile duct. I've also reviewed the radiology read as above.  I did discuss with her that given her gallbladder polyps as well as her symptoms of biliary colic that she would likely benefit from having her gallbladder removed.  The risks, benefits, complications, treatment options, and expected outcomes were discussed with the patient. The possibilities of bleeding, recurrent infection, finding a normal gallbladder, perforation of viscus organs, damage to surrounding structures, bile leak, abscess formation, needing a drain placed, the need for additional procedures, reaction to medication, pulmonary aspiration,  failure to diagnose a  condition, the possible need to convert to an open procedure, and creating a complication requiring transfusion or operation were discussed with the patient. The patient and/or family concurred with the proposed plan, giving informed consent. We will schedule her for Lap chole on 11/7.

## 2016-03-14 ENCOUNTER — Telehealth: Payer: Self-pay

## 2016-03-14 ENCOUNTER — Observation Stay
Admission: EM | Admit: 2016-03-14 | Discharge: 2016-03-16 | DRG: 418 | Disposition: A | Discharge status: Home / Self Care | Payer: Managed Care, Other (non HMO) | Attending: Surgery | Admitting: Surgery

## 2016-03-14 ENCOUNTER — Emergency Department: Payer: Managed Care, Other (non HMO)

## 2016-03-14 ENCOUNTER — Encounter: Payer: Self-pay | Admitting: Emergency Medicine

## 2016-03-14 DIAGNOSIS — Z6838 Body mass index (BMI) 38.0-38.9, adult: Secondary | ICD-10-CM | POA: Diagnosis not present

## 2016-03-14 DIAGNOSIS — E669 Obesity, unspecified: Secondary | ICD-10-CM | POA: Insufficient documentation

## 2016-03-14 DIAGNOSIS — Z8249 Family history of ischemic heart disease and other diseases of the circulatory system: Secondary | ICD-10-CM | POA: Diagnosis not present

## 2016-03-14 DIAGNOSIS — Z880 Allergy status to penicillin: Secondary | ICD-10-CM | POA: Insufficient documentation

## 2016-03-14 DIAGNOSIS — L4050 Arthropathic psoriasis, unspecified: Secondary | ICD-10-CM | POA: Diagnosis not present

## 2016-03-14 DIAGNOSIS — N179 Acute kidney failure, unspecified: Secondary | ICD-10-CM | POA: Diagnosis not present

## 2016-03-14 DIAGNOSIS — Z881 Allergy status to other antibiotic agents status: Secondary | ICD-10-CM | POA: Insufficient documentation

## 2016-03-14 DIAGNOSIS — K810 Acute cholecystitis: Secondary | ICD-10-CM | POA: Diagnosis not present

## 2016-03-14 DIAGNOSIS — Z79899 Other long term (current) drug therapy: Secondary | ICD-10-CM | POA: Diagnosis not present

## 2016-03-14 DIAGNOSIS — R1011 Right upper quadrant pain: Secondary | ICD-10-CM

## 2016-03-14 LAB — CBC
HEMATOCRIT: 44.5 % (ref 35.0–47.0)
HEMOGLOBIN: 15.3 g/dL (ref 12.0–16.0)
MCH: 30 pg (ref 26.0–34.0)
MCHC: 34.3 g/dL (ref 32.0–36.0)
MCV: 87.4 fL (ref 80.0–100.0)
PLATELETS: 267 10*3/uL (ref 150–440)
RBC: 5.09 MIL/uL (ref 3.80–5.20)
RDW: 13.3 % (ref 11.5–14.5)
WBC: 7.4 10*3/uL (ref 3.6–11.0)

## 2016-03-14 LAB — URINALYSIS COMPLETE WITH MICROSCOPIC (ARMC ONLY)
Bilirubin Urine: NEGATIVE
GLUCOSE, UA: NEGATIVE mg/dL
Ketones, ur: NEGATIVE mg/dL
LEUKOCYTES UA: NEGATIVE
Nitrite: NEGATIVE
PH: 6 (ref 5.0–8.0)
Protein, ur: NEGATIVE mg/dL
SPECIFIC GRAVITY, URINE: 1.006 (ref 1.005–1.030)

## 2016-03-14 LAB — COMPREHENSIVE METABOLIC PANEL
ALK PHOS: 58 U/L (ref 38–126)
ALT: 19 U/L (ref 14–54)
ANION GAP: 7 (ref 5–15)
AST: 23 U/L (ref 15–41)
Albumin: 4.4 g/dL (ref 3.5–5.0)
BUN: 15 mg/dL (ref 6–20)
CALCIUM: 9.7 mg/dL (ref 8.9–10.3)
CHLORIDE: 107 mmol/L (ref 101–111)
CO2: 24 mmol/L (ref 22–32)
CREATININE: 1.77 mg/dL — AB (ref 0.44–1.00)
GFR, EST AFRICAN AMERICAN: 41 mL/min — AB (ref >60)
GFR, EST NON AFRICAN AMERICAN: 36 mL/min — AB (ref >60)
Glucose, Bld: 108 mg/dL — ABNORMAL HIGH (ref 65–99)
Potassium: 3.9 mmol/L (ref 3.5–5.1)
SODIUM: 138 mmol/L (ref 135–145)
Total Bilirubin: 0.3 mg/dL (ref 0.3–1.2)
Total Protein: 7.8 g/dL (ref 6.5–8.1)

## 2016-03-14 LAB — LIPASE, BLOOD: LIPASE: 29 U/L (ref 11–51)

## 2016-03-14 LAB — POCT PREGNANCY, URINE: PREG TEST UR: NEGATIVE

## 2016-03-14 MED ORDER — METRONIDAZOLE IN NACL 5-0.79 MG/ML-% IV SOLN
500.0000 mg | Freq: Three times a day (TID) | INTRAVENOUS | Status: DC
  Administered 2016-03-15 – 2016-03-16 (×4): 500 mg via INTRAVENOUS
  Filled 2016-03-14 (×7): qty 100

## 2016-03-14 MED ORDER — ACETAMINOPHEN 500 MG PO TABS
1000.0000 mg | ORAL_TABLET | Freq: Four times a day (QID) | ORAL | Status: DC
  Administered 2016-03-14 – 2016-03-16 (×4): 1000 mg via ORAL
  Filled 2016-03-14 (×4): qty 2

## 2016-03-14 MED ORDER — SULFASALAZINE 500 MG PO TABS
500.0000 mg | ORAL_TABLET | Freq: Two times a day (BID) | ORAL | Status: DC
  Administered 2016-03-16: 500 mg via ORAL
  Filled 2016-03-14: qty 1

## 2016-03-14 MED ORDER — METRONIDAZOLE IN NACL 5-0.79 MG/ML-% IV SOLN
500.0000 mg | Freq: Once | INTRAVENOUS | Status: AC
  Administered 2016-03-14: 500 mg via INTRAVENOUS
  Filled 2016-03-14: qty 100

## 2016-03-14 MED ORDER — HEPARIN SODIUM (PORCINE) 5000 UNIT/ML IJ SOLN
5000.0000 [IU] | Freq: Three times a day (TID) | INTRAMUSCULAR | Status: DC
  Administered 2016-03-14 – 2016-03-16 (×4): 5000 [IU] via SUBCUTANEOUS
  Filled 2016-03-14 (×4): qty 1

## 2016-03-14 MED ORDER — PROMETHAZINE HCL 25 MG/ML IJ SOLN
12.5000 mg | Freq: Once | INTRAMUSCULAR | Status: AC
Start: 2016-03-14 — End: 2016-03-14
  Administered 2016-03-14: 12.5 mg via INTRAVENOUS
  Filled 2016-03-14: qty 1

## 2016-03-14 MED ORDER — HYDROMORPHONE HCL 1 MG/ML IJ SOLN
0.5000 mg | INTRAMUSCULAR | Status: DC | PRN
  Administered 2016-03-14 – 2016-03-15 (×4): 0.5 mg via INTRAVENOUS
  Filled 2016-03-14 (×4): qty 1

## 2016-03-14 MED ORDER — ONDANSETRON 4 MG PO TBDP: 4.0000 mg | ORAL_TABLET | Freq: Four times a day (QID) | ORAL | Status: DC | PRN

## 2016-03-14 MED ORDER — FAMOTIDINE IN NACL 20-0.9 MG/50ML-% IV SOLN
20.0000 mg | Freq: Two times a day (BID) | INTRAVENOUS | Status: DC
  Administered 2016-03-14 – 2016-03-16 (×4): 20 mg via INTRAVENOUS
  Filled 2016-03-14 (×5): qty 50

## 2016-03-14 MED ORDER — SODIUM CHLORIDE 0.9 % IV SOLN
INTRAVENOUS | Status: DC
  Administered 2016-03-14 – 2016-03-15 (×2): via INTRAVENOUS

## 2016-03-14 MED ORDER — SODIUM CHLORIDE 0.9 % IV BOLUS (SEPSIS)
1000.0000 mL | Freq: Once | INTRAVENOUS | Status: AC
  Administered 2016-03-14: 1000 mL via INTRAVENOUS

## 2016-03-14 MED ORDER — POLYETHYLENE GLYCOL 3350 17 G PO PACK: 17.0000 g | PACK | Freq: Every day | ORAL | Status: DC | PRN

## 2016-03-14 MED ORDER — FENTANYL CITRATE (PF) 100 MCG/2ML IJ SOLN
100.0000 ug | INTRAMUSCULAR | Status: DC | PRN
  Administered 2016-03-14: 100 ug via INTRAVENOUS
  Filled 2016-03-14: qty 2

## 2016-03-14 MED ORDER — CIPROFLOXACIN IN D5W 400 MG/200ML IV SOLN
400.0000 mg | Freq: Two times a day (BID) | INTRAVENOUS | Status: DC
  Administered 2016-03-15 – 2016-03-16 (×3): 400 mg via INTRAVENOUS
  Filled 2016-03-14 (×4): qty 200

## 2016-03-14 MED ORDER — OXYCODONE HCL 5 MG PO TABS
5.0000 mg | ORAL_TABLET | ORAL | Status: DC | PRN
  Administered 2016-03-14: 5 mg via ORAL
  Administered 2016-03-15 – 2016-03-16 (×3): 10 mg via ORAL
  Filled 2016-03-14: qty 2
  Filled 2016-03-14: qty 1
  Filled 2016-03-14 (×2): qty 2

## 2016-03-14 MED ORDER — ONDANSETRON HCL 4 MG/2ML IJ SOLN: 4.0000 mg | Freq: Four times a day (QID) | INTRAMUSCULAR | Status: DC | PRN

## 2016-03-14 NOTE — ED Provider Notes (Signed)
[facility] Emergency Department Provider Note    First MD Initiated Contact with Patient 03/14/16 1520     (approximate)  I have reviewed the triage vital signs and the nursing notes.   HISTORY  Chief Complaint Abdominal Pain    HPI Angela Chandler is a 40 y.o. female  with recent diagnosis of gallbladder polyps presents with worsening right upper quadrant pain and low-grade fevers. Patient arrives to the ER tachycardic complaining of worsening right upper quadrant pain associated with nausea. No vomiting or diarrhea. Patient was recently started on Cipro for UTI. Patient has been followed up and outpatient surgery clinic with plan for elective cholecystectomy in early November. Due to worsening symptoms she came to the ER today.  She currently rates the pain as a 7 out of 10 in severity with radiation through to her back.   Past Medical History:  Diagnosis Date  . Arthritis   . Asthma     Patient Active Problem List   Diagnosis Date Noted  . Gallbladder polyp 03/13/2016  . Biliary colic 03/13/2016    Past Surgical History:  Procedure Laterality Date  . APPENDECTOMY    . TONSILLECTOMY      Prior to Admission medications   Medication Sig Start Date End Date Taking? Authorizing Provider  ciprofloxacin (CIPRO) 500 MG tablet Take 1 tablet (500 mg total) by mouth 2 (two) times daily. 03/07/16 03/17/16  Emily Filbert, MD  ibuprofen (ADVIL,MOTRIN) 800 MG tablet Take 1 tablet (800 mg total) by mouth every 6 (six) hours as needed. 12/05/15 12/04/16  Charlynne Pander, MD  oxyCODONE-acetaminophen (PERCOCET) 5-325 MG tablet Take 2 tablets by mouth every 6 (six) hours as needed for moderate pain or severe pain. 03/07/16   Emily Filbert, MD  pantoprazole (PROTONIX) 40 MG tablet Take 1 tablet (40 mg total) by mouth daily. 03/07/16 03/07/17  Emily Filbert, MD  sulfaSALAzine (AZULFIDINE) 500 MG tablet Take 1 tablet by mouth 2 (two) times daily  with a meal. 09/18/15   Historical Provider, MD    Allergies Keflex [cephalexin]; Penicillin g; and Penicillins  Family History  Problem Relation Age of Onset  . Hypertension Father     Social History Social History  Substance Use Topics  . Smoking status: Never Smoker  . Smokeless tobacco: Never Used  . Alcohol use No    Review of Systems Patient denies headaches, rhinorrhea, blurry vision, numbness, shortness of breath, chest pain, edema, cough, abdominal pain, nausea, vomiting, diarrhea, dysuria, fevers, rashes or hallucinations unless otherwise stated above in HPI. ____________________________________________   PHYSICAL EXAM:  VITAL SIGNS: Vitals:   03/14/16 1348  BP: (!) 136/92  Pulse: (!) 107  Resp: 20  Temp: 99.2 F (37.3 C)    Constitutional: Alert and oriented. Well appearing and in no acute distress. Eyes: Conjunctivae are normal. PERRL. EOMI. Head: Atraumatic. Nose: No congestion/rhinnorhea. Mouth/Throat: Mucous membranes are moist.  Oropharynx non-erythematous. Neck: No stridor. Painless ROM. No cervical spine tenderness to palpation Hematological/Lymphatic/Immunilogical: No cervical lymphadenopathy. Cardiovascular: Normal rate, regular rhythm. Grossly normal heart sounds.  Good peripheral circulation. Respiratory: Normal respiratory effort.  No retractions. Lungs CTAB. Gastrointestinal: Soft with RUQ ttp. No distention. No abdominal bruits. No CVA tenderness. Genitourinary:  Musculoskeletal: No lower extremity tenderness nor edema.  No joint effusions. Neurologic:  Normal speech and language. No gross focal neurologic deficits are appreciated. No gait instability. Skin:  Skin is warm, dry and intact. No rash noted. Psychiatric: Mood and affect are normal.  Speech and behavior are normal.  ____________________________________________   LABS (all labs ordered are listed, but only abnormal results are displayed)  Results for orders placed or performed  during the hospital encounter of 03/14/16 (from the past 24 hour(s))  Lipase, blood     Status: None   Collection Time: 03/14/16  1:50 PM  Result Value Ref Range   Lipase 29 11 - 51 U/L  Comprehensive metabolic panel     Status: Abnormal   Collection Time: 03/14/16  1:50 PM  Result Value Ref Range   Sodium 138 135 - 145 mmol/L   Potassium 3.9 3.5 - 5.1 mmol/L   Chloride 107 101 - 111 mmol/L   CO2 24 22 - 32 mmol/L   Glucose, Bld 108 (H) 65 - 99 mg/dL   BUN 15 6 - 20 mg/dL   Creatinine, Ser 4.09 (H) 0.44 - 1.00 mg/dL   Calcium 9.7 8.9 - 81.1 mg/dL   Total Protein 7.8 6.5 - 8.1 g/dL   Albumin 4.4 3.5 - 5.0 g/dL   AST 23 15 - 41 U/L   ALT 19 14 - 54 U/L   Alkaline Phosphatase 58 38 - 126 U/L   Total Bilirubin 0.3 0.3 - 1.2 mg/dL   GFR calc non Af Amer 36 (L) >60 mL/min   GFR calc Af Amer 41 (L) >60 mL/min   Anion gap 7 5 - 15  CBC     Status: None   Collection Time: 03/14/16  1:50 PM  Result Value Ref Range   WBC 7.4 3.6 - 11.0 K/uL   RBC 5.09 3.80 - 5.20 MIL/uL   Hemoglobin 15.3 12.0 - 16.0 g/dL   HCT 91.4 78.2 - 95.6 %   MCV 87.4 80.0 - 100.0 fL   MCH 30.0 26.0 - 34.0 pg   MCHC 34.3 32.0 - 36.0 g/dL   RDW 21.3 08.6 - 57.8 %   Platelets 267 150 - 440 K/uL  Urinalysis complete, with microscopic     Status: Abnormal   Collection Time: 03/14/16  1:50 PM  Result Value Ref Range   Color, Urine YELLOW (A) YELLOW   APPearance CLEAR (A) CLEAR   Glucose, UA NEGATIVE NEGATIVE mg/dL   Bilirubin Urine NEGATIVE NEGATIVE   Ketones, ur NEGATIVE NEGATIVE mg/dL   Specific Gravity, Urine 1.006 1.005 - 1.030   Hgb urine dipstick 1+ (A) NEGATIVE   pH 6.0 5.0 - 8.0   Protein, ur NEGATIVE NEGATIVE mg/dL   Nitrite NEGATIVE NEGATIVE   Leukocytes, UA NEGATIVE NEGATIVE   RBC / HPF 0-5 0 - 5 RBC/hpf   WBC, UA 0-5 0 - 5 WBC/hpf   Bacteria, UA RARE (A) NONE SEEN   Squamous Epithelial / LPF 0-5 (A) NONE SEEN   Mucous PRESENT    Hyaline Casts, UA PRESENT   Pregnancy, urine POC     Status:  None   Collection Time: 03/14/16  2:01 PM  Result Value Ref Range   Preg Test, Ur NEGATIVE NEGATIVE   ____________________________________________ ____________________________________________  RADIOLOGY  I personally reviewed all radiographic images ordered to evaluate for the above acute complaints and reviewed radiology reports and findings.  These findings were personally discussed with the patient.  Please see medical record for radiology report. ____________________________________________   PROCEDURES  Procedure(s) performed: none    Critical Care performed: no ____________________________________________   INITIAL IMPRESSION / ASSESSMENT AND PLAN / ED COURSE  Pertinent labs & imaging results that were available during my care of the patient were  reviewed by me and considered in my medical decision making (see chart for details).  DDX: cholecystectomy, cholelithiasis, panceratitis  Marylynn PearsonJennifer Debono is a 40 y.o. who presents to the ED with worsening of right upper quadrant pain. Ordered right upper quadrant ultrasound to evaluate for worsening pathology and possible cholecystitis. She does not have a leukocytosis but does have evidence of a KI. She does have low-grade fever and tachycardia. We'll give IV fluids. Has consult to general surgery regarding worsening pain. Based on her presentation Dr. Aleen CampiPiscoya of general surgery has agreed to admit patient for IV antibiotics, IV fluids and evaluation for operative management.  Have discussed with the patient and available family all diagnostics and treatments performed thus far and all questions were answered to the best of my ability. The patient demonstrates understanding and agreement with plan.   Clinical Course     ____________________________________________   FINAL CLINICAL IMPRESSION(S) / ED DIAGNOSES  Final diagnoses:  RUQ pain      NEW MEDICATIONS STARTED DURING THIS VISIT:  New Prescriptions   No  medications on file     Note:  This document was prepared using Dragon voice recognition software and may include unintentional dictation errors.    Willy EddyPatrick Issachar Broady, MD 03/14/16 [id number]

## 2016-03-14 NOTE — Telephone Encounter (Signed)
Patient called in this time and states that beginning this morning she has had constant worsening Right Upper Quadrant Pain with Nausea and fever of 100.3. She denies vomiting and has been taking her prescribed Cipro per the instructions given.  Spoke with Dr. Excell Seltzerooper and he would like patient to go to the Emergency Room. Dr. Aleen CampiPiscoya is surgeon on call and will be made aware of this patient.

## 2016-03-14 NOTE — ED Notes (Signed)
Dr. Piscoya at bedside at this time.  

## 2016-03-14 NOTE — ED Notes (Signed)
Informed RN bed ready 

## 2016-03-14 NOTE — ED Notes (Signed)
This RN

## 2016-03-14 NOTE — ED Triage Notes (Signed)
Pt to ed with c/o abd pain that started this am.  Pt reports hx of gallbladder problems.  Pt also reports fever today.

## 2016-03-14 NOTE — H&P (Signed)
Date of Admission:  03/14/2016  Reason for Admission:  Abdominal pain  History of Present Illness: Angela PearsonJennifer Steeves is a 40 y.o. female who presents with 1 day history of right upper quadrant abdominal pain associated with nausea. Patient was seen in emergency room on 10/19 and was diagnosed with gallbladder polyps and biliary colic. She was seen in the office by Dr. Orvis BrillLoflin on 10/24 and was set up for surgery for 11/7. However she called the office today because she was having persistent right upper quadrant abdominal pain worse than her previous episodes with a low-grade temperature of 100.3 associated with chills. She was advised to come to the emergency room for further evaluation. She reports having nausea as well but no episodes of emesis. She had been on ciprofloxacin for a urinary tract infection that was diagnosed on her last emergency room visit. She has mentioned that with this her stools are somewhat softer but there is no diarrhea or constipation. Otherwise denies any dysuria or hematuria or blood in the stools. Also denies any chest pain or shortness of breath.   Past Medical History: Past Medical History:  Diagnosis Date  . Psoriatic Arthritis   . Childhood asthma      Past Surgical History: Past Surgical History:  Procedure Laterality Date  . APPENDECTOMY    . TONSILLECTOMY      Home Medications: Prior to Admission medications   Medication Sig Start Date End Date Taking? Authorizing Provider  ibuprofen (ADVIL,MOTRIN) 800 MG tablet Take 1 tablet (800 mg total) by mouth every 6 (six) hours as needed. 12/05/15 12/04/16 Yes Charlynne Panderavid Hsienta Yao, MD  pantoprazole (PROTONIX) 40 MG tablet Take 1 tablet (40 mg total) by mouth daily. 03/07/16 03/07/17 Yes Emily FilbertJonathan E Williams, MD  sulfaSALAzine (AZULFIDINE) 500 MG tablet Take 1 tablet by mouth 2 (two) times daily with a meal. 09/18/15  Yes Historical Provider, MD    Allergies: Allergies  Allergen Reactions  . Keflex [Cephalexin]  Nausea And Vomiting and Rash  . Penicillin G Rash and Nausea And Vomiting  . Penicillins Rash    Has patient had a PCN reaction causing immediate rash, facial/tongue/throat swelling, SOB or lightheadedness with hypotension: Yes Has patient had a PCN reaction causing severe rash involving mucus membranes or skin necrosis: No Has patient had a PCN reaction that required hospitalization No Has patient had a PCN reaction occurring within the last 10 years: No If all of the above answers are "NO", then may proceed with Cephalosporin use.     Social History:  reports that she has never smoked. She has never used smokeless tobacco. She reports that she does not drink alcohol or use drugs.   Family History: Family History  Problem Relation Age of Onset  . Hypertension Father     Review of Systems: Review of Systems  Constitutional: Positive for chills and fever.  Eyes: Negative for blurred vision.  Respiratory: Negative for cough and shortness of breath.   Cardiovascular: Negative for chest pain and leg swelling.  Gastrointestinal: Positive for abdominal pain and nausea. Negative for blood in stool, constipation, diarrhea, heartburn and vomiting.  Genitourinary: Negative for dysuria and hematuria.  Musculoskeletal: Negative for myalgias.  Skin: Negative for rash.  Neurological: Negative for dizziness and headaches.  Psychiatric/Behavioral: Negative for depression.    Physical Exam BP (!) 148/91 (BP Location: Right Arm)   Pulse 89   Temp 99.2 F (37.3 C) (Oral)   Resp 16   Ht 5\' 6"  (1.676 m)   Wt  107 kg (236 lb)   LMP 03/07/2016   SpO2 99%   BMI 38.09 kg/m  CONSTITUTIONAL: No acute distress. HEENT:  Normocephalic, atraumatic, extraocular motion intact. NECK: Trachea is midline, and there is no jugular venous distension.  LYMPH NODES:  Lymph nodes in the neck are not enlarged. RESPIRATORY:  Lungs are clear, and breath sounds are equal bilaterally. Normal respiratory effort  without pathologic use of accessory muscles. CARDIOVASCULAR: Heart is regular without murmurs, gallops, or rubs. GI: The abdomen is soft, obese, nondistended, with tenderness to palpation the right upper quadrant and a positive Murphy sign. There were no palpable masses.  MUSCULOSKELETAL:  Normal muscle strength and tone in all four extremities.  No peripheral edema or cyanosis. SKIN: Skin turgor is normal. There are no pathologic skin lesions.  NEUROLOGIC:  Motor and sensation is grossly normal.  Cranial nerves are grossly intact. PSYCH:  Alert and oriented to person, place and time. Affect is normal.  Laboratory Analysis: Results for orders placed or performed during the hospital encounter of 03/14/16 (from the past 24 hour(s))  Lipase, blood     Status: None   Collection Time: 03/14/16  1:50 PM  Result Value Ref Range   Lipase 29 11 - 51 U/L  Comprehensive metabolic panel     Status: Abnormal   Collection Time: 03/14/16  1:50 PM  Result Value Ref Range   Sodium 138 135 - 145 mmol/L   Potassium 3.9 3.5 - 5.1 mmol/L   Chloride 107 101 - 111 mmol/L   CO2 24 22 - 32 mmol/L   Glucose, Bld 108 (H) 65 - 99 mg/dL   BUN 15 6 - 20 mg/dL   Creatinine, Ser 1.61 (H) 0.44 - 1.00 mg/dL   Calcium 9.7 8.9 - 09.6 mg/dL   Total Protein 7.8 6.5 - 8.1 g/dL   Albumin 4.4 3.5 - 5.0 g/dL   AST 23 15 - 41 U/L   ALT 19 14 - 54 U/L   Alkaline Phosphatase 58 38 - 126 U/L   Total Bilirubin 0.3 0.3 - 1.2 mg/dL   GFR calc non Af Amer 36 (L) >60 mL/min   GFR calc Af Amer 41 (L) >60 mL/min   Anion gap 7 5 - 15  CBC     Status: None   Collection Time: 03/14/16  1:50 PM  Result Value Ref Range   WBC 7.4 3.6 - 11.0 K/uL   RBC 5.09 3.80 - 5.20 MIL/uL   Hemoglobin 15.3 12.0 - 16.0 g/dL   HCT 04.5 40.9 - 81.1 %   MCV 87.4 80.0 - 100.0 fL   MCH 30.0 26.0 - 34.0 pg   MCHC 34.3 32.0 - 36.0 g/dL   RDW 91.4 78.2 - 95.6 %   Platelets 267 150 - 440 K/uL  Urinalysis complete, with microscopic     Status: Abnormal    Collection Time: 03/14/16  1:50 PM  Result Value Ref Range   Color, Urine YELLOW (A) YELLOW   APPearance CLEAR (A) CLEAR   Glucose, UA NEGATIVE NEGATIVE mg/dL   Bilirubin Urine NEGATIVE NEGATIVE   Ketones, ur NEGATIVE NEGATIVE mg/dL   Specific Gravity, Urine 1.006 1.005 - 1.030   Hgb urine dipstick 1+ (A) NEGATIVE   pH 6.0 5.0 - 8.0   Protein, ur NEGATIVE NEGATIVE mg/dL   Nitrite NEGATIVE NEGATIVE   Leukocytes, UA NEGATIVE NEGATIVE   RBC / HPF 0-5 0 - 5 RBC/hpf   WBC, UA 0-5 0 - 5 WBC/hpf   Bacteria,  UA RARE (A) NONE SEEN   Squamous Epithelial / LPF 0-5 (A) NONE SEEN   Mucous PRESENT    Hyaline Casts, UA PRESENT   Pregnancy, urine POC     Status: None   Collection Time: 03/14/16  2:01 PM  Result Value Ref Range   Preg Test, Ur NEGATIVE NEGATIVE    Imaging: US Abdomen Limited Ruq  Result Date: 03/14/2016 CLINICAL DATA:  Worsening right upper quadrant pain and fever. EXAM: US ABDOMEN LIMITED - RIGHT UPPER QUADRANT COMPARISON:  03/07/2016 FINDINGS: Gallbladder: No evidence of gallstones or gallbladder wall thickening. Several tiny polyps are noted along the nondependent gallbladder wall, largest measuring 4 mm. No sonographic Murphy sign noted by sonographer. Common bile duct: Diameter: 3 mm, within normal limits. Liver: No focal lesion identified. Within normal limits in parenchymal echogenicity. IMPRESSION: Stable tiny gallbladder polyps. No evidence of gallstones or biliary ductal dilatation. Electronically Signed   By: Myles Rosenthal M.D.   On: 03/14/2016 16:46    Assessment and Plan: This is a 40 y.o. female who presents with a one-day history of right upper quadrant abdominal pain that is more intense and severe than her previous episodes of biliary colic. I personally reviewed her laboratory as well as imaging studies and discussed them with the patient. She does endorse having fever and chills at home and she has a positive Murphy's sign on exam. Her white blood cell count is  normal but she has been on antibiotics at home and her creatinine has doubled at 1.77 today. She will be admitted to the general surgery service. She'll be nothing by mouth with appropriate IV fluid hydration, IV cipro and flagyl, pain control, and nausea control. She'll be taken to the operating room likely tomorrow as an add-on for laparoscopic cholecystectomy. This is pending that her renal function improves with hydration. The risks and benefits of the procedure were discussed with the patient as well. She understands this plan and all of her questions were answered.   Howie Ill, MD [facility]

## 2016-03-15 ENCOUNTER — Encounter: Payer: Self-pay | Admitting: *Deleted

## 2016-03-15 ENCOUNTER — Inpatient Hospital Stay: Payer: Managed Care, Other (non HMO) | Admitting: Anesthesiology

## 2016-03-15 ENCOUNTER — Encounter
Admission: EM | Disposition: A | Discharge status: Home / Self Care | Payer: Self-pay | Source: Home / Self Care | Attending: Student in an Organized Health Care Education/Training Program

## 2016-03-15 DIAGNOSIS — K810 Acute cholecystitis: Secondary | ICD-10-CM

## 2016-03-15 HISTORY — PX: CHOLECYSTECTOMY: SHX55

## 2016-03-15 LAB — CBC WITH DIFFERENTIAL/PLATELET
BASOS ABS: 0.1 10*3/uL (ref 0–0.1)
Basophils Relative: 1 %
EOS PCT: 3 %
Eosinophils Absolute: 0.2 10*3/uL (ref 0–0.7)
HEMATOCRIT: 38 % (ref 35.0–47.0)
Hemoglobin: 13 g/dL (ref 12.0–16.0)
LYMPHS ABS: 2.1 10*3/uL (ref 1.0–3.6)
LYMPHS PCT: 30 %
MCH: 30.2 pg (ref 26.0–34.0)
MCHC: 34.3 g/dL (ref 32.0–36.0)
MCV: 88 fL (ref 80.0–100.0)
Monocytes Absolute: 0.6 10*3/uL (ref 0.2–0.9)
Monocytes Relative: 9 %
NEUTROS ABS: 4.1 10*3/uL (ref 1.4–6.5)
Neutrophils Relative %: 57 %
Platelets: 230 10*3/uL (ref 150–440)
RBC: 4.32 MIL/uL (ref 3.80–5.20)
RDW: 12.9 % (ref 11.5–14.5)
WBC: 7.1 10*3/uL (ref 3.6–11.0)

## 2016-03-15 LAB — BASIC METABOLIC PANEL
Anion gap: 6 (ref 5–15)
BUN: 12 mg/dL (ref 6–20)
CHLORIDE: 110 mmol/L (ref 101–111)
CO2: 23 mmol/L (ref 22–32)
CREATININE: 1.35 mg/dL — AB (ref 0.44–1.00)
Calcium: 8.3 mg/dL — ABNORMAL LOW (ref 8.9–10.3)
GFR calc non Af Amer: 49 mL/min — ABNORMAL LOW (ref >60)
GFR, EST AFRICAN AMERICAN: 57 mL/min — AB (ref >60)
Glucose, Bld: 105 mg/dL — ABNORMAL HIGH (ref 65–99)
POTASSIUM: 3.4 mmol/L — AB (ref 3.5–5.1)
SODIUM: 139 mmol/L (ref 135–145)

## 2016-03-15 LAB — SURGICAL PCR SCREEN
MRSA, PCR: NEGATIVE
STAPHYLOCOCCUS AUREUS: NEGATIVE

## 2016-03-15 LAB — MAGNESIUM: MAGNESIUM: 2 mg/dL (ref 1.7–2.4)

## 2016-03-15 SURGERY — LAPAROSCOPIC CHOLECYSTECTOMY
Anesthesia: General | Site: Abdomen

## 2016-03-15 MED ORDER — LACTATED RINGERS IV SOLN
INTRAVENOUS | Status: DC
  Administered 2016-03-15 (×2): via INTRAVENOUS

## 2016-03-15 MED ORDER — FENTANYL CITRATE (PF) 100 MCG/2ML IJ SOLN
INTRAMUSCULAR | Status: AC
  Administered 2016-03-15: 50 ug via INTRAVENOUS
  Filled 2016-03-15: qty 2

## 2016-03-15 MED ORDER — FENTANYL CITRATE (PF) 100 MCG/2ML IJ SOLN
25.0000 ug | INTRAMUSCULAR | Status: DC | PRN
  Administered 2016-03-15 (×3): 50 ug via INTRAVENOUS

## 2016-03-15 MED ORDER — KCL IN DEXTROSE-NACL 20-5-0.45 MEQ/L-%-% IV SOLN
INTRAVENOUS | Status: DC
  Administered 2016-03-15 – 2016-03-16 (×3): via INTRAVENOUS
  Filled 2016-03-15 (×6): qty 1000

## 2016-03-15 MED ORDER — FENTANYL CITRATE (PF) 100 MCG/2ML IJ SOLN
INTRAMUSCULAR | Status: AC
  Filled 2016-03-15: qty 2

## 2016-03-15 MED ORDER — LIDOCAINE HCL (CARDIAC) 20 MG/ML IV SOLN
INTRAVENOUS | Status: DC | PRN
  Administered 2016-03-15: 100 mg via INTRAVENOUS

## 2016-03-15 MED ORDER — MEPERIDINE HCL 25 MG/ML IJ SOLN: 6.2500 mg | INTRAMUSCULAR | Status: DC | PRN

## 2016-03-15 MED ORDER — MIDAZOLAM HCL 2 MG/2ML IJ SOLN
INTRAMUSCULAR | Status: DC | PRN
  Administered 2016-03-15: 2 mg via INTRAVENOUS

## 2016-03-15 MED ORDER — PROPOFOL 10 MG/ML IV BOLUS
INTRAVENOUS | Status: DC | PRN
  Administered 2016-03-15: 200 mg via INTRAVENOUS

## 2016-03-15 MED ORDER — FENTANYL CITRATE (PF) 100 MCG/2ML IJ SOLN
25.0000 ug | INTRAMUSCULAR | Status: AC | PRN
  Administered 2016-03-15: 50 ug via INTRAVENOUS

## 2016-03-15 MED ORDER — BUPIVACAINE-EPINEPHRINE (PF) 0.25% -1:200000 IJ SOLN
INTRAMUSCULAR | Status: AC
  Filled 2016-03-15: qty 30

## 2016-03-15 MED ORDER — SUGAMMADEX SODIUM 500 MG/5ML IV SOLN
INTRAVENOUS | Status: DC | PRN
  Administered 2016-03-15: 250 mg via INTRAVENOUS

## 2016-03-15 MED ORDER — HYDROMORPHONE HCL 1 MG/ML IJ SOLN
INTRAMUSCULAR | Status: AC
  Administered 2016-03-15: 0.5 mg via INTRAVENOUS
  Filled 2016-03-15: qty 1

## 2016-03-15 MED ORDER — OXYCODONE HCL 5 MG/5ML PO SOLN: 5.0000 mg | Freq: Once | ORAL | Status: DC | PRN

## 2016-03-15 MED ORDER — ONDANSETRON HCL 4 MG/2ML IJ SOLN
INTRAMUSCULAR | Status: DC | PRN
  Administered 2016-03-15: 4 mg via INTRAVENOUS

## 2016-03-15 MED ORDER — HYDROMORPHONE HCL 1 MG/ML IJ SOLN
0.5000 mg | INTRAMUSCULAR | Status: DC | PRN
  Administered 2016-03-15 – 2016-03-16 (×5): 0.5 mg via INTRAVENOUS
  Filled 2016-03-15 (×5): qty 1

## 2016-03-15 MED ORDER — CIPROFLOXACIN IN D5W 400 MG/200ML IV SOLN
INTRAVENOUS | Status: AC
Start: 2016-03-15 — End: 2016-03-15
  Filled 2016-03-15: qty 200

## 2016-03-15 MED ORDER — HYDROMORPHONE HCL 1 MG/ML IJ SOLN
0.5000 mg | INTRAMUSCULAR | Status: DC | PRN
  Administered 2016-03-15 (×2): 0.5 mg via INTRAVENOUS

## 2016-03-15 MED ORDER — LABETALOL HCL 5 MG/ML IV SOLN
INTRAVENOUS | Status: DC | PRN
  Administered 2016-03-15 (×2): 5 mg via INTRAVENOUS

## 2016-03-15 MED ORDER — FENTANYL CITRATE (PF) 100 MCG/2ML IJ SOLN
INTRAMUSCULAR | Status: DC | PRN
  Administered 2016-03-15 (×4): 50 ug via INTRAVENOUS
  Administered 2016-03-15: 100 ug via INTRAVENOUS

## 2016-03-15 MED ORDER — OXYCODONE HCL 5 MG PO TABS: 5.0000 mg | ORAL_TABLET | Freq: Once | ORAL | Status: DC | PRN

## 2016-03-15 MED ORDER — DEXAMETHASONE SODIUM PHOSPHATE 10 MG/ML IJ SOLN
INTRAMUSCULAR | Status: DC | PRN
  Administered 2016-03-15: 10 mg via INTRAVENOUS

## 2016-03-15 MED ORDER — PROMETHAZINE HCL 25 MG/ML IJ SOLN: 6.2500 mg | INTRAMUSCULAR | Status: DC | PRN

## 2016-03-15 MED ORDER — BUPIVACAINE-EPINEPHRINE 0.25% -1:200000 IJ SOLN
INTRAMUSCULAR | Status: DC | PRN
  Administered 2016-03-15: 30 mL

## 2016-03-15 MED ORDER — ROCURONIUM BROMIDE 100 MG/10ML IV SOLN
INTRAVENOUS | Status: DC | PRN
  Administered 2016-03-15: 40 mg via INTRAVENOUS
  Administered 2016-03-15: 10 mg via INTRAVENOUS

## 2016-03-15 SURGICAL SUPPLY — 42 items
APPLIER CLIP 5 13 M/L LIGAMAX5 (MISCELLANEOUS) ×3
BLADE SURG 15 STRL LF DISP TIS (BLADE) ×1 IMPLANT
BLADE SURG 15 STRL SS (BLADE) ×2
CANISTER SUCT 1200ML W/VALVE (MISCELLANEOUS) ×3 IMPLANT
CATH CHOLANGI 4FR 420404F (CATHETERS) IMPLANT
CHLORAPREP W/TINT 10.5 ML (MISCELLANEOUS) ×3 IMPLANT
CLIP APPLIE 5 13 M/L LIGAMAX5 (MISCELLANEOUS) ×1 IMPLANT
CONRAY 60ML FOR OR (MISCELLANEOUS) ×3 IMPLANT
DRAPE C-ARM XRAY 36X54 (DRAPES) IMPLANT
ELECT REM PT RETURN 9FT ADLT (ELECTROSURGICAL) ×3
ELECTRODE REM PT RTRN 9FT ADLT (ELECTROSURGICAL) ×1 IMPLANT
ENDOPOUCH RETRIEVER 10 (MISCELLANEOUS) ×3 IMPLANT
GLOVE SURG SYN 7.0 (GLOVE) ×3 IMPLANT
GLOVE SURG SYN 7.5  E (GLOVE) ×2
GLOVE SURG SYN 7.5 E (GLOVE) ×1 IMPLANT
GOWN STRL REUS W/ TWL LRG LVL3 (GOWN DISPOSABLE) ×2 IMPLANT
GOWN STRL REUS W/TWL LRG LVL3 (GOWN DISPOSABLE) ×4
IRRIGATION STRYKERFLOW (MISCELLANEOUS) IMPLANT
IRRIGATOR STRYKERFLOW (MISCELLANEOUS)
IV CATH ANGIO 12GX3 LT BLUE (NEEDLE) ×3 IMPLANT
IV NS 1000ML (IV SOLUTION)
IV NS 1000ML BAXH (IV SOLUTION) IMPLANT
JACKSON PRATT 10 (INSTRUMENTS) IMPLANT
L-HOOK LAP DISP 36CM (ELECTROSURGICAL) ×3
LABEL OR SOLS (LABEL) ×3 IMPLANT
LHOOK LAP DISP 36CM (ELECTROSURGICAL) ×1 IMPLANT
LIQUID BAND (GAUZE/BANDAGES/DRESSINGS) ×3 IMPLANT
NDL HPO THNWL 1X22GA REG BVL (NEEDLE) ×1 IMPLANT
NDL SAFETY 22GX1.5 (NEEDLE) ×3 IMPLANT
NEEDLE SAFETY 22GX1 (NEEDLE) ×2
PACK LAP CHOLECYSTECTOMY (MISCELLANEOUS) ×3 IMPLANT
PENCIL ELECTRO HAND CTR (MISCELLANEOUS) ×3 IMPLANT
SCISSORS METZENBAUM CVD 33 (INSTRUMENTS) ×3 IMPLANT
SLEEVE ADV FIXATION 5X100MM (TROCAR) ×9 IMPLANT
SPONGE EXCIL AMD DRAIN 4X4 6P (MISCELLANEOUS) IMPLANT
SUT MNCRL 4-0 (SUTURE) ×2
SUT MNCRL 4-0 27XMFL (SUTURE) ×1
SUT VICRYL 0 AB UR-6 (SUTURE) ×3 IMPLANT
SUTURE MNCRL 4-0 27XMF (SUTURE) ×1 IMPLANT
TROCAR 130MM GELPORT  DAV (MISCELLANEOUS) ×3 IMPLANT
TROCAR Z-THREAD OPTICAL 5X100M (TROCAR) ×3 IMPLANT
TUBING INSUFFLATOR HI FLOW (MISCELLANEOUS) ×3 IMPLANT

## 2016-03-15 NOTE — Progress Notes (Signed)
Per Dr. Excell Seltzerooper okay to change dilaudid 0.5mg  q 2 hours PRN and give 1 dose now.

## 2016-03-15 NOTE — Op Note (Signed)
  Procedure Date:  03/15/2016  Pre-operative Diagnosis:  Acute cholecystitis  Post-operative Diagnosis: Acute cholecystitis  Procedure:  Laparoscopic cholecystectomy  Surgeon:  Howie IllJose Luis Demisha Nokes, MD  Anesthesia:  General endotracheal  Estimated Blood Loss:  5 ml  Specimens:  gallbladder  Complications:  None  Indications for Procedure:  This is a 40 y.o. female who presents with abdominal pain and workup revealing acute cholecystitis.  The benefits, complications, treatment options, and expected outcomes were discussed with the patient. The risks of bleeding, infection, recurrence of symptoms, failure to resolve symptoms, bile duct damage, bile duct leak, retained common bile duct stone, bowel injury, and need for further procedures were all discussed with the patient and she was willing to proceed.  Description of Procedure: The patient was correctly identified in the preoperative area and brought into the operating room.  The patient was placed supine with VTE prophylaxis in place.  Appropriate time-outs were performed.  Anesthesia was induced and the patient was intubated.  Appropriate antibiotics were infused.  The abdomen was prepped and draped in a sterile fashion. An infraumbilical incision was made. A cutdown technique was used to enter the abdominal cavity without injury, and a Hasson trocar was inserted.  Pneumoperitoneum was obtained with appropriate opening pressures.  A 5-mm port was placed in the subxiphoid area and two 5-mm ports were placed in the right upper quadrant under direct visualization.  The gallbladder was identified and found to be acutely inflammed.  The fundus was grasped and retracted cephalad.  Adhesions were lysed bluntly and with electrocautery. The infundibulum was grasped and retracted laterally, exposing the peritoneum overlying the gallbladder.  This was incised with electrocautery and extended on either side of the gallbladder.  The cystic duct and  cystic artery were clearly identified and bluntly dissected.  Both were clipped twice proximally and once distally, cutting in between.  The gallbladder was taken from the gallbladder fossa in a retrograde fashion with electrocautery. The gallbladder was placed in an Endocatch bag and brought out via the umbilical incision. The liver bed was inspected and any bleeding was controlled with electrocautery. The right upper quadrant was then inspected again revealing intact clips, no bleeding, and no ductal injury.  The area was thoroughly irrigated.  The 5 mm ports were removed under direct visualization and the Hasson trocar was removed.  The fascial opening was closed using 0 vicryl suture.  Local anesthetic was infused in all incisions and the incisions were closed with 4-0 Monocryl.  The wounds were cleaned and sealed with DermaBond.  The patient was emerged from anesthesia and extubated and brought to the recovery room for further management.  The patient tolerated the procedure well and all counts were correct at the end of the case.   Howie IllJose Luis Ivan Maskell, MD

## 2016-03-15 NOTE — Progress Notes (Signed)
03/15/2016  Subjective: No acute events overnight. Patient reports that her nausea has improved but she still has right upper quadrant pain. Otherwise no fevers, chills, chest pain, or shortness of breath.  Vital signs: Temp:  [97.9 F (36.6 C)-99.2 F (37.3 C)] 97.9 F (36.6 C) (10/27 0455) Pulse Rate:  [81-107] 81 (10/27 0455) Resp:  [16-22] 22 (10/27 0455) BP: (111-148)/(60-92) 111/60 (10/27 0455) SpO2:  [95 %-100 %] 95 % (10/27 0455) Weight:  [107 kg (236 lb)-107.4 kg (236 lb 12.8 oz)] 107.4 kg (236 lb 12.8 oz) (10/27 16100635)   Intake/Output: 10/26 0701 - 10/27 0700 In: 994 [P.O.:260; I.V.:734] Out: 700 [Urine:700] Last BM Date: 03/14/16  Physical Exam: Constitutional: No acute distress Cardiac:  Regular rhythm and rate Pulm: No respiratory distress Abdomen: Soft, nondistended, with mild tenderness over the right upper quadrant.  Labs:   Recent Labs  03/14/16 1350 03/15/16 0513  WBC 7.4 7.1  HGB 15.3 13.0  HCT 44.5 38.0  PLT 267 230    Recent Labs  03/14/16 1350 03/15/16 0513  NA 138 139  K 3.9 3.4*  CL 107 110  CO2 24 23  GLUCOSE 108* 105*  BUN 15 12  CREATININE 1.77* 1.35*  CALCIUM 9.7 8.3*   No results for input(s): LABPROT, INR in the last 72 hours.  Imaging: [place] Abdomen Limited Ruq  Result Date: 03/14/2016 CLINICAL DATA:  Worsening right upper quadrant pain and fever. EXAM: US ABDOMEN LIMITED - RIGHT UPPER QUADRANT COMPARISON:  03/07/2016 FINDINGS: Gallbladder: No evidence of gallstones or gallbladder wall thickening. Several tiny polyps are noted along the nondependent gallbladder wall, largest measuring 4 mm. No sonographic Murphy sign noted by sonographer. Common bile duct: Diameter: 3 mm, within normal limits. Liver: No focal lesion identified. Within normal limits in parenchymal echogenicity. IMPRESSION: Stable tiny gallbladder polyps. No evidence of gallstones or biliary ductal dilatation. Electronically Signed   By: Myles RosenthalJohn  Stahl M.D.   On:  03/14/2016 16:46    Assessment/Plan: 40 year old female with acute cholecystitis.  -Her creatinine has improved today with IV fluid hydration. We'll continue hydration today as well. -We'll take the patient to the operating room today for laparoscopic cholecystectomy. Risks and benefits of the procedure have been explained to the patient she was given informed consent.   Howie IllJose Luis Akeira Lahm, MD [facility]

## 2016-03-15 NOTE — Anesthesia Preprocedure Evaluation (Signed)
Anesthesia Evaluation  Patient identified by MRN, date of birth, ID band Patient awake    Reviewed: Allergy & Precautions, NPO status , Patient's Chart, lab work & pertinent test results  History of Anesthesia Complications Negative for: history of anesthetic complications  Airway Mallampati: I  TM Distance: >3 FB Neck ROM: Full    Dental  (+) Implants   Pulmonary neg sleep apnea, neg COPD,    breath sounds clear to auscultation- rhonchi (-) wheezing      Cardiovascular Exercise Tolerance: Good (-) hypertension(-) CAD and (-) Past MI  Rhythm:Regular Rate:Normal - Systolic murmurs and - Diastolic murmurs    Neuro/Psych negative neurological ROS  negative psych ROS   GI/Hepatic negative GI ROS, Neg liver ROS,   Endo/Other  negative endocrine ROSneg diabetes  Renal/GU negative Renal ROS     Musculoskeletal  (+) Arthritis  (psoriatic),   Abdominal (+) + obese,   Peds  Hematology negative hematology ROS (+)   Anesthesia Other Findings   Reproductive/Obstetrics                             Anesthesia Physical Anesthesia Plan  ASA: II  Anesthesia Plan: General   Post-op Pain Management:    Induction: Intravenous  Airway Management Planned: Oral ETT  Additional Equipment:   Intra-op Plan:   Post-operative Plan: Extubation in OR  Informed Consent: I have reviewed the patients History and Physical, chart, labs and discussed the procedure including the risks, benefits and alternatives for the proposed anesthesia with the patient or authorized representative who has indicated his/her understanding and acceptance.   Dental advisory given  Plan Discussed with: CRNA and Anesthesiologist  Anesthesia Plan Comments:        Anesthesia Quick Evaluation

## 2016-03-15 NOTE — Anesthesia Procedure Notes (Signed)
Procedure Name: Intubation Date/Time: 03/15/2016 4:25 PM Performed by: Michaele OfferSAVAGE, Jaleeah Slight Pre-anesthesia Checklist: Patient identified, Emergency Drugs available, Suction available, Patient being monitored and Timeout performed Patient Re-evaluated:Patient Re-evaluated prior to inductionOxygen Delivery Method: Circle system utilized Preoxygenation: Pre-oxygenation with 100% oxygen Intubation Type: IV induction Ventilation: Mask ventilation without difficulty Laryngoscope Size: Mac and 3 Grade View: Grade I Tube type: Oral Tube size: 7.0 mm Number of attempts: 1 Airway Equipment and Method: Rigid stylet Placement Confirmation: ETT inserted through vocal cords under direct vision,  positive ETCO2 and breath sounds checked- equal and bilateral Secured at: 21 cm Tube secured with: Tape Dental Injury: Teeth and Oropharynx as per pre-operative assessment

## 2016-03-15 NOTE — Interval H&P Note (Signed)
History and Physical Interval Note:  03/15/2016 8:33 AM  Angela DikeJennifer Rottmann  has presented today for surgery, with the diagnosis of GALLBLADDER  The various methods of treatment have been discussed with the patient and family. After consideration of risks, benefits and other options for treatment, the patient has consented to  Procedure(s): LAPAROSCOPIC CHOLECYSTECTOMY (N/A) as a surgical intervention .  The patient's history has been reviewed, patient examined, no change in status, stable for surgery.  I have reviewed the patient's chart and labs.  Questions were answered to the patient's satisfaction.     [name]

## 2016-03-15 NOTE — Transfer of Care (Signed)
Immediate Anesthesia Transfer of Care Note  Patient: Angela Chandler  Procedure(s) Performed: Procedure(s): LAPAROSCOPIC CHOLECYSTECTOMY (N/A)  Patient Location: PACU  Anesthesia Type:General  Level of Consciousness: awake, alert  and oriented  Airway & Oxygen Therapy: Patient connected to face mask oxygen  Post-op Assessment: Post -op Vital signs reviewed and stable  Post vital signs: stable  Last Vitals:  Vitals:   03/15/16 1435 03/15/16 1748  BP: (!) 146/87 (!) 142/79  Pulse: 84 90  Resp: 18 15  Temp: 36.1 C 36.6 C    Last Pain:  Vitals:   03/15/16 1748  TempSrc: Temporal  PainSc:          Complications: No apparent anesthesia complications

## 2016-03-16 LAB — BASIC METABOLIC PANEL
ANION GAP: 5 (ref 5–15)
BUN: 6 mg/dL (ref 6–20)
CALCIUM: 8.7 mg/dL — AB (ref 8.9–10.3)
CO2: 24 mmol/L (ref 22–32)
Chloride: 107 mmol/L (ref 101–111)
Creatinine, Ser: 0.98 mg/dL (ref 0.44–1.00)
GFR calc Af Amer: >60 mL/min (ref >60)
GFR calc non Af Amer: >60 mL/min (ref >60)
GLUCOSE: 162 mg/dL — AB (ref 65–99)
Potassium: 4.1 mmol/L (ref 3.5–5.1)
Sodium: 136 mmol/L (ref 135–145)

## 2016-03-16 MED ORDER — ACETAMINOPHEN 500 MG PO TABS: 1000.0000 mg | ORAL_TABLET | Freq: Four times a day (QID) | ORAL | 0 refills | Status: AC

## 2016-03-16 MED ORDER — OXYCODONE HCL 5 MG PO TABS: 5.0000 mg | ORAL_TABLET | ORAL | 0 refills | Status: DC | PRN

## 2016-03-16 NOTE — Progress Notes (Addendum)
Pt A and O x 4. VSS. Pt tolerating diet well. Minimal complaints of pain with meds given to control. IV removed intact, prescriptions given. Pt voiced understanding of discharge instructions with no further questions. Pt discharged via wheelchair with nurse aide.   

## 2016-03-16 NOTE — Progress Notes (Signed)
03/16/2016  Subjective: Patient is 1 Day Post-Op status post laparoscopic cholecystectomy. No acute events overnight. Patient did have additional pain that required change of the frequency of her IV Dilaudid. Otherwise no nausea. This morning reports that her pain is better is tolerating a full liquid diet.  Vital signs: Temp:  [97 F (36.1 C)-98.5 F (36.9 C)] 98.5 F (36.9 C) (10/28 0442) Pulse Rate:  [71-96] 96 (10/28 0442) Resp:  [13-20] 20 (10/28 0442) BP: (124-146)/(65-91) 124/65 (10/28 0442) SpO2:  [95 %-100 %] 98 % (10/28 0442) Weight:  [107 kg (236 lb)] 107 kg (236 lb) (10/27 1435)   Intake/Output: 10/27 0701 - 10/28 0700 In: 2983 [P.O.:118; I.V.:2715; IV Piggyback:150] Out: 3350 [Urine:3350] Last BM Date: 03/14/16  Physical Exam: Constitutional: No acute distress Abdomen: Soft, obese, nondistended, appropriately tender to palpation over the incisions. All incisions are clean dry and intact with no evidence of infection.  Labs:   Recent Labs  03/14/16 1350 03/15/16 0513  WBC 7.4 7.1  HGB 15.3 13.0  HCT 44.5 38.0  PLT 267 230    Recent Labs  03/14/16 1350 03/15/16 0513  NA 138 139  K 3.9 3.4*  CL 107 110  CO2 24 23  GLUCOSE 108* 105*  BUN 15 12  CREATININE 1.77* 1.35*  CALCIUM 9.7 8.3*   No results for input(s): LABPROT, INR in the last 72 hours.  Imaging: No results found.  Assessment/Plan: 40 year old female status post upper scopic cholecystectomy.  -We'll advance today to a regular diet. -Follow-up BMP today and discontinue IV fluids as long as her creatinine continues to improve. -Likely discharge to home today.   Howie IllJose Luis Keyron Pokorski, MD [facility]

## 2016-03-16 NOTE — Discharge Summary (Signed)
Patient ID: Angela PearsonJennifer Domine MRN: [id number] DOB/AGE: 38/07/1975 37 y.o.  Admit date: 03/14/2016 Discharge date: 03/16/2016   Discharge Diagnoses:  Active Problems:   Acute cholecystitis   Procedures: Laparoscopic cholecystectomy  Hospital Course: Patient was admitted on 10/26 with acute cholecystitis. She was also noted to have acute renal failure with a creatinine of 1.7 up from a baseline of 0.8. She was aggressively hydrated and her creatinine improved the next day to 1.35. She was taken to the operating room on 10/27 and underwent a laparoscopic cholecystectomy with no complications. She tolerated the procedure well. Postoperatively her diet was slowly advanced. On postop day 1 her peripheral pain was better controlled she denied any nausea and was tolerating a diet with no complications. She was deemed ready for discharge to home.  Consults: None  Disposition: 01-Home or Self Care  Discharge Instructions    Call MD for:  difficulty breathing, headache or visual disturbances    Complete by:  As directed    Call MD for:  persistant nausea and vomiting    Complete by:  As directed    Call MD for:  redness, tenderness, or signs of infection (pain, swelling, redness, odor or green/yellow discharge around incision site)    Complete by:  As directed    Call MD for:  severe uncontrolled pain    Complete by:  As directed    Call MD for:  temperature >100.4    Complete by:  As directed    Diet - low sodium heart healthy    Complete by:  As directed    Discharge instructions    Complete by:  As directed    Patient may shower but do not scrub wounds heavily and dab dry only. Do not apply ointments or hydrogen peroxide to the incisions.   Driving Restrictions    Complete by:  As directed    Do not drive while taking narcotics for pain control.   Increase activity slowly    Complete by:  As directed    Lifting restrictions    Complete by:  As directed    No heavy lifting of more  than 10 pounds for 4 weeks.   No dressing needed    Complete by:  As directed        Medication List    STOP taking these medications   ibuprofen 800 MG tablet Commonly known as:  MOTRIN IB     TAKE these medications   acetaminophen 500 MG tablet Commonly known as:  TYLENOL Take 2 tablets (1,000 mg total) by mouth every 6 (six) hours.   oxyCODONE 5 MG immediate release tablet Commonly known as:  Oxy IR/ROXICODONE Take 1-2 tablets (5-10 mg total) by mouth every 4 (four) hours as needed for moderate pain.   pantoprazole 40 MG tablet Commonly known as:  PROTONIX Take 1 tablet (40 mg total) by mouth daily.   sulfaSALAzine 500 MG tablet Commonly known as:  AZULFIDINE Take 1 tablet by mouth 2 (two) times daily with a meal.      Follow-up Information    Henrene DodgeJose Permelia Bamba, MD Follow up in 2 week(s).   Specialty:  Surgery Why:  Follow up in 2-3 weeks. Contact information: [address]  [address] [place] [id number] [phone]

## 2016-03-18 ENCOUNTER — Encounter: Payer: Self-pay | Admitting: Surgery

## 2016-03-19 ENCOUNTER — Other Ambulatory Visit: Payer: Managed Care, Other (non HMO)

## 2016-03-19 LAB — SURGICAL PATHOLOGY

## 2016-03-19 NOTE — Anesthesia Postprocedure Evaluation (Signed)
Anesthesia Post Note  Patient: Angela Chandler  Procedure(s) Performed: Procedure(s) (LRB): LAPAROSCOPIC CHOLECYSTECTOMY (N/A)  Patient location during evaluation: PACU Anesthesia Type: General Level of consciousness: awake and alert and oriented Pain management: pain level controlled Vital Signs Assessment: post-procedure vital signs reviewed and stable Respiratory status: spontaneous breathing Cardiovascular status: blood pressure returned to baseline Anesthetic complications: no    Last Vitals:  Vitals:   03/15/16 2027 03/16/16 0442  BP: 132/74 124/65  Pulse: 94 96  Resp: 18 20  Temp: 36.4 C 36.9 C    Last Pain:  Vitals:   03/16/16 1215  TempSrc:   PainSc: 5                  Navil Kole

## 2016-03-20 ENCOUNTER — Telehealth: Payer: Self-pay

## 2016-03-20 NOTE — Telephone Encounter (Signed)
Patient concerned that her umbilical incision is more red than it has been. Patient did send image over and this was reviewed with Dr. Aleen CampiPiscoya.  Umbilical incision appears to be dry and intact, very mild erythema and no drainage.  Patient was instructed to continue to watch for spreading redness and fever. She is to call if this is the case. She verbalized understanding of this.  Patient scheduled to follow-up with Dr. Aleen CampiPiscoya on 04/02/16. She will call if she has more questions or concerns.

## 2016-03-26 ENCOUNTER — Ambulatory Visit: Admit: 2016-03-26 | Payer: Managed Care, Other (non HMO) | Admitting: Surgery

## 2016-03-26 SURGERY — LAPAROSCOPIC CHOLECYSTECTOMY WITH INTRAOPERATIVE CHOLANGIOGRAM
Anesthesia: General

## 2016-03-27 ENCOUNTER — Other Ambulatory Visit: Payer: Self-pay

## 2016-04-02 ENCOUNTER — Encounter: Payer: Self-pay | Admitting: Surgery

## 2016-04-02 ENCOUNTER — Ambulatory Visit (INDEPENDENT_AMBULATORY_CARE_PROVIDER_SITE_OTHER): Payer: 59 | Admitting: Surgery

## 2016-04-02 VITALS — BP 145/78 | HR 75 | Temp 98.1°F | Ht 66.0 in | Wt 244.0 lb

## 2016-04-02 DIAGNOSIS — K810 Acute cholecystitis: Secondary | ICD-10-CM

## 2016-04-02 DIAGNOSIS — Z9049 Acquired absence of other specified parts of digestive tract: Secondary | ICD-10-CM

## 2016-04-02 NOTE — Patient Instructions (Signed)
Please call our office if you have any questions or concerns.  

## 2016-04-02 NOTE — Progress Notes (Signed)
04/02/2016  HPI: Patient is s/p laparoscopic cholecystectomy on 10/27. Patient to call the office earlier in November with concerns about her periumbilical incision and sent the picture to be evaluated. This showed ecchymosis of the skin around the incision but otherwise no evidence of infection. Otherwise the patient reports that she's continued to do well and she feels very well compared to before surgery and is very grateful. She reports being able tolerate regular food with no pain complaints no nausea no vomiting. Denies any fevers, chills, chest pain, shortness of breath. She reports having somewhat loose stools and having about 3 bowel movements per day but no blood in the stools and no foul-smelling stool.  Vital signs: BP (!) 145/78   Pulse 75   Temp 98.1 F (36.7 C) (Oral)   Ht 5\' 6"  (1.676 m)   Wt 110.7 kg (244 lb)   LMP 03/07/2016   BMI 39.38 kg/m     Physical Exam: Constitutional: No acute distress Abdomen: Soft, nondistended, obese, nontender to palpation. All 4 incisions are clean dry and intact and healing well with no evidence of any infection.    Assessment/Plan: 40 year old female status post laparoscopic cholecystectomy on 10/27. X  -Patient still has no heavy lifting restriction of more than 10-15 pounds until 11/24. Patient may resume all her activities at that point. -Patient has been reassured that her loose bowel movements could be related to cholecystectomy and as her intestines are adjusting to the increased bile output  her stools will improve as well. -Patient may follow-up on an as-needed basis.   Howie IllJose Luis Brinkley Peet, MD [facility]

## 2017-12-15 IMAGING — CT CT ABD-PELV W/ CM
2 of 4 series · 16 of 46 positions shown, 18 images · IV contrast (iopamidol)
Comparison: None.

CLINICAL DATA: Mid upper abdominal pain and burning with vomiting
for 1 day, status post appendectomy

EXAM:
CT ABDOMEN AND PELVIS WITH CONTRAST
TECHNIQUE: Multidetector CT imaging of the abdomen and pelvis was performed
using the standard protocol following bolus administration of
intravenous contrast.
CONTRAST:  100mL [id number] IOPAMIDOL (HK8DMI-2OO) INJECTION 61%

[Series 2: routine abd pel with · axial · 0.87mm/px · z∈[-1030,-575]mm · 13 of 101 slices shown, 15 images]
[im 5/101  soft-tissue]
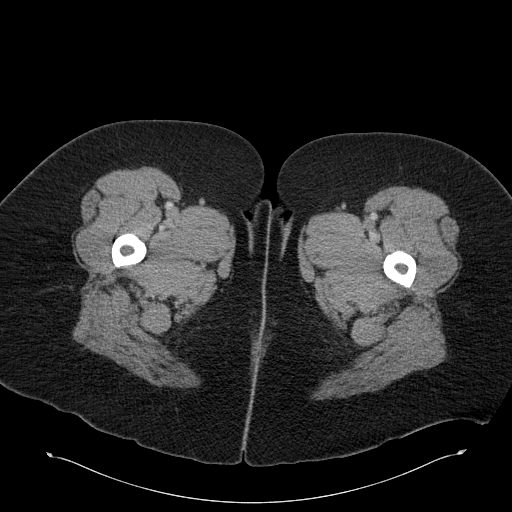
[im 5/101  bone]
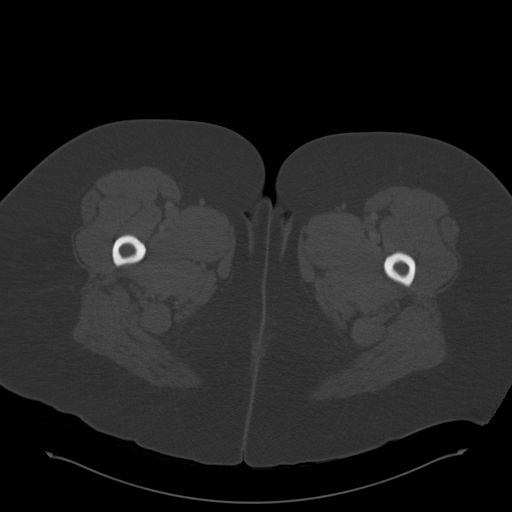
[im 13/101  soft-tissue]
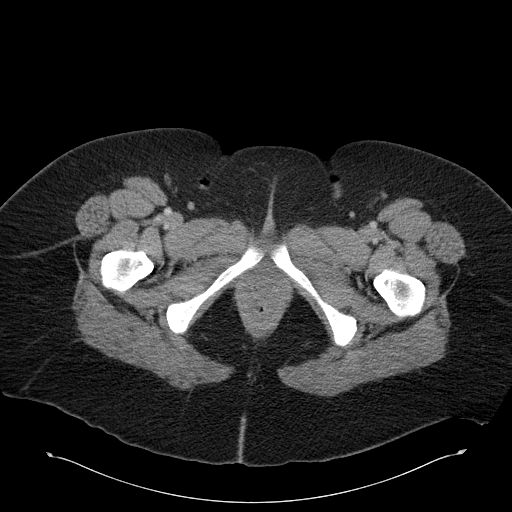
[im 21/101  soft-tissue]
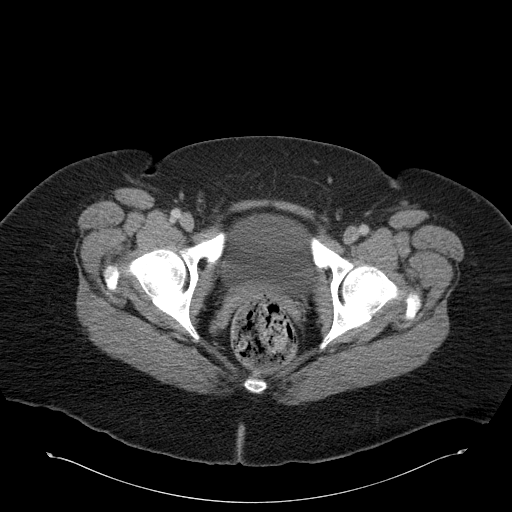
[im 30/101  soft-tissue]
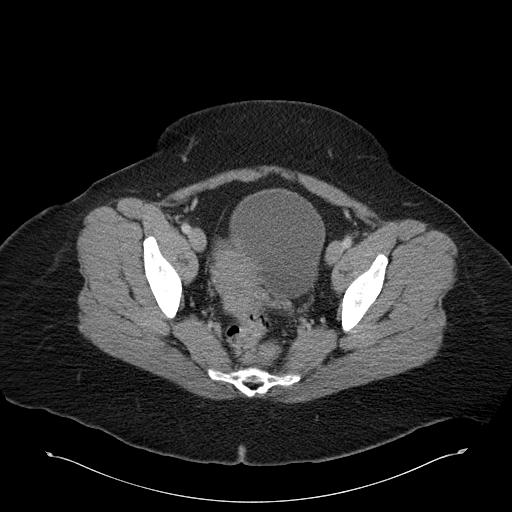
[im 34/101  soft-tissue]
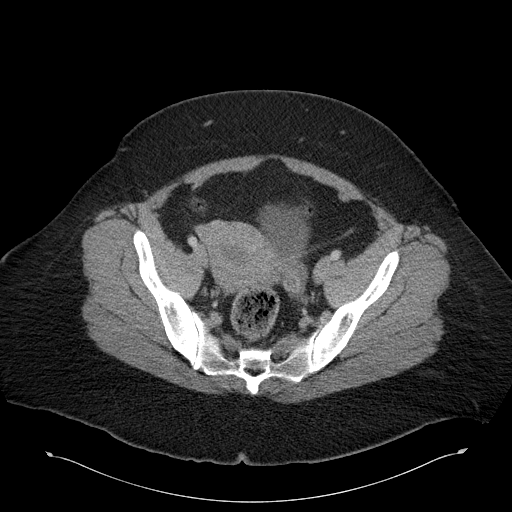
[im 42/101  soft-tissue]
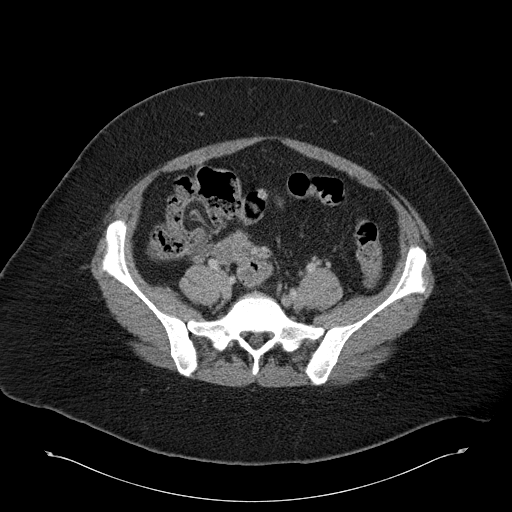
[im 51/101  soft-tissue]
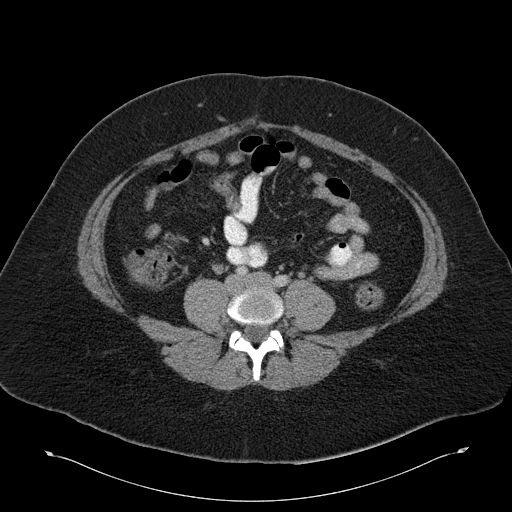
[im 59/101  soft-tissue]
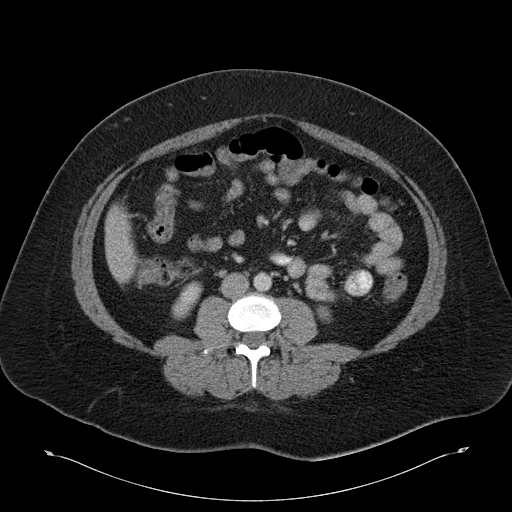
[im 67/101  soft-tissue]
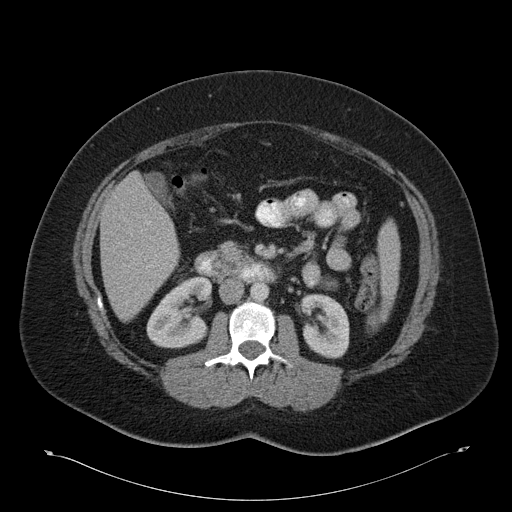
[im 67/101  bone]
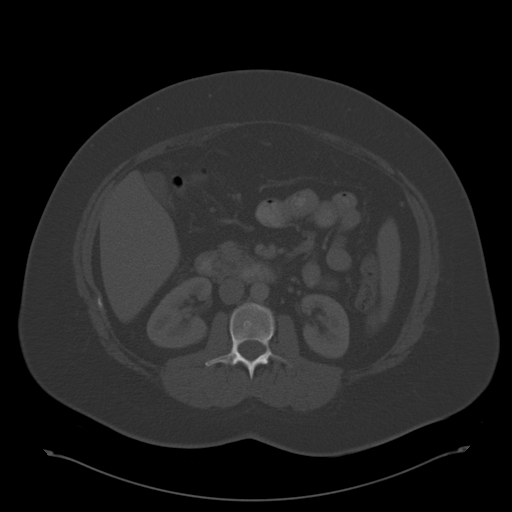
[im 71/101  soft-tissue]
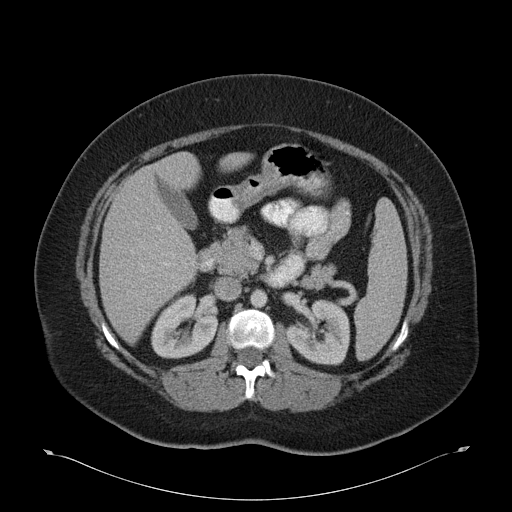
[im 80/101  soft-tissue]
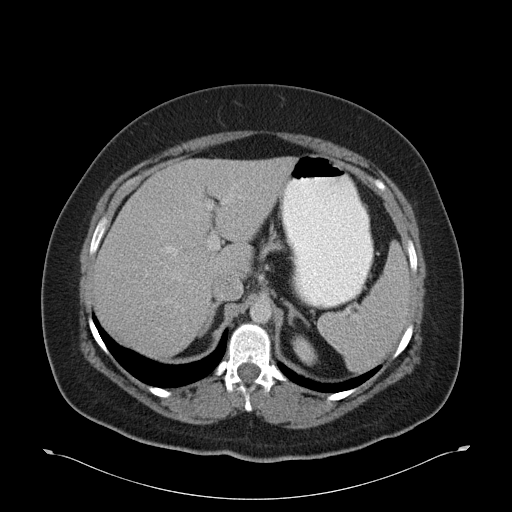
[im 88/101  soft-tissue]
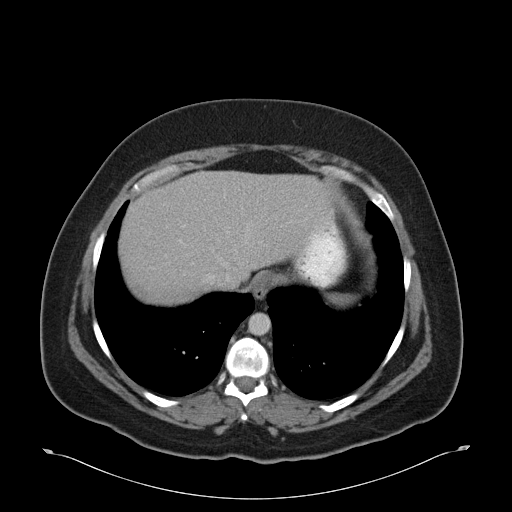
[im 96/101  soft-tissue]
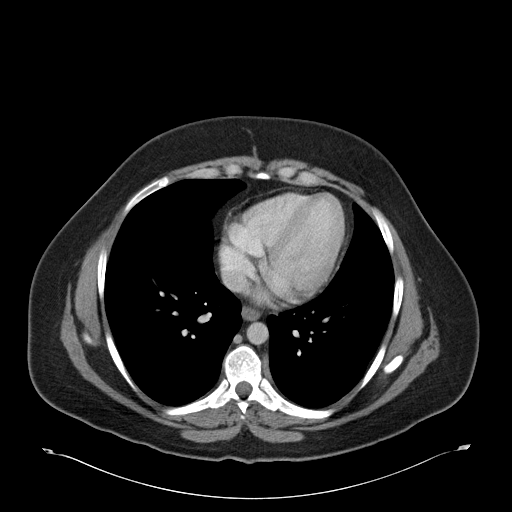

[Series 5: cor routine abd pel with · coronal · 0.88mm/px · 3 of 153 slices shown]
[im 51/153  soft-tissue]
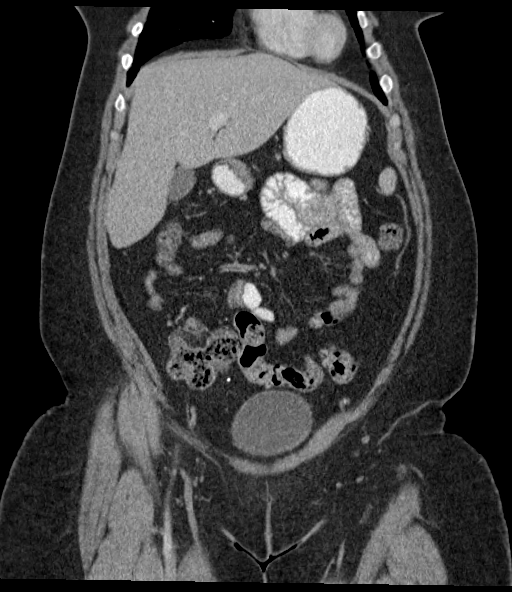
[im 68/153  soft-tissue]
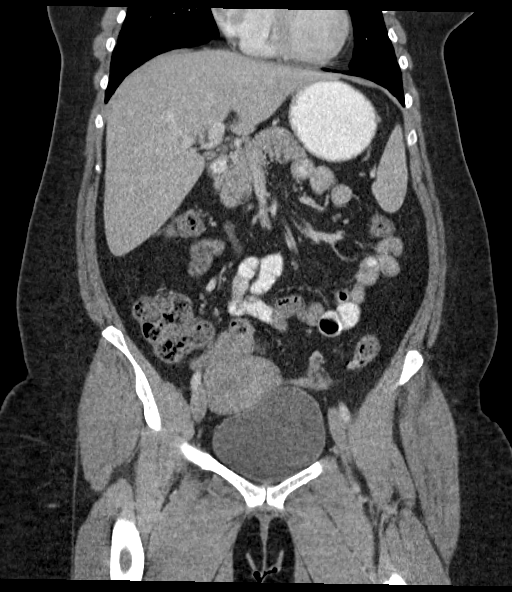
[im 85/153  soft-tissue]
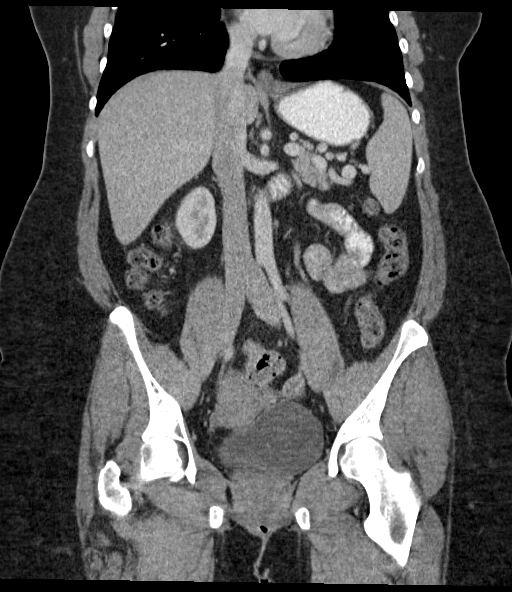

[16 of 46 positions shown; findings below may reference images not displayed]

FINDINGS: Lower chest:  Lung bases are unremarkable.

Hepatobiliary: Enhanced liver shows no focal mass. No biliary ductal
dilatation. No calcified gallstones are noted within gallbladder.

Pancreas: Enhanced pancreas is unremarkable.

Spleen: Enhanced spleen is unremarkable.

Adrenals/Urinary Tract: No adrenal gland mass. Enhanced kidneys are
symmetrical in size. No hydronephrosis or hydroureter. No
nephrolithiasis. No calcified ureteral calculi.

Stomach/Bowel: No small bowel obstruction. Oral contrast material
was given to the patient. Moderate stool noted within cecum. No
pericecal inflammation. The patient is status post appendectomy. No
evidence of colitis or diverticulitis. Moderate stool noted within
rectum. The rectum measures 6.1 cm in diameter. Mild fecal impaction
cannot be excluded.

Vascular/Lymphatic: No aortic aneurysm. No retroperitoneal or
mesenteric adenopathy.

Reproductive: The uterus is anteflexed unremarkable. No adnexal
masses noted. Probable hemorrhagic follicle within left ovary
measures 1.8 cm. No pelvic free fluid is noted.

Other: The urinary bladder is unremarkable. No ascites or free
abdominal air. No inguinal adenopathy. Tiny umbilical hernia
containing fat without evidence of acute complication.

Musculoskeletal: No destructive bony lesions are noted. Minimal
degenerative changes lower thoracic spine.
IMPRESSION: 1. There is no evidence of acute inflammatory process within
abdomen.
2. Moderate stool noted within cecum. No pericecal inflammation. The
patient is status post appendectomy.
3. No small bowel obstruction.
4. Moderate stool noted within rectum. Mild fecal impaction cannot
be excluded.
5. Question hemorrhagic follicle within left ovary measures 1.8 cm.
No pelvic free fluid.
